# Patient Record
Sex: Female | Born: 1975 | Race: White | Hispanic: No | Marital: Married | Smoking: Former smoker
Health system: Southern US, Community
[De-identification: ages and names within clinical notes are randomized; demographics above are authoritative.]

## PROBLEM LIST (undated history)

## (undated) DIAGNOSIS — R87619 Unspecified abnormal cytological findings in specimens from cervix uteri: Secondary | ICD-10-CM

## (undated) DIAGNOSIS — Z87891 Personal history of nicotine dependence: Secondary | ICD-10-CM

## (undated) DIAGNOSIS — E785 Hyperlipidemia, unspecified: Secondary | ICD-10-CM

## (undated) DIAGNOSIS — N979 Female infertility, unspecified: Secondary | ICD-10-CM

## (undated) DIAGNOSIS — E349 Endocrine disorder, unspecified: Secondary | ICD-10-CM

## (undated) DIAGNOSIS — D649 Anemia, unspecified: Secondary | ICD-10-CM

## (undated) DIAGNOSIS — IMO0002 Reserved for concepts with insufficient information to code with codable children: Secondary | ICD-10-CM

## (undated) DIAGNOSIS — Z803 Family history of malignant neoplasm of breast: Secondary | ICD-10-CM

## (undated) HISTORY — DX: Hyperlipidemia, unspecified: E78.5

## (undated) HISTORY — PX: TOOTH EXTRACTION: SUR596

## (undated) HISTORY — DX: Anemia, unspecified: D64.9

## (undated) HISTORY — DX: Endocrine disorder, unspecified: E34.9

## (undated) HISTORY — DX: Female infertility, unspecified: N97.9

## (undated) HISTORY — DX: Personal history of nicotine dependence: Z87.891

## (undated) HISTORY — PX: DILATION AND CURETTAGE OF UTERUS: SHX78

## (undated) HISTORY — DX: Reserved for concepts with insufficient information to code with codable children: IMO0002

## (undated) HISTORY — DX: Family history of malignant neoplasm of breast: Z80.3

## (undated) HISTORY — DX: Unspecified abnormal cytological findings in specimens from cervix uteri: R87.619

---

## 1998-09-19 ENCOUNTER — Other Ambulatory Visit: Admission: RE | Admit: 1998-09-19 | Discharge: 1998-09-19 | Payer: Self-pay | Admitting: Obstetrics & Gynecology

## 1999-12-22 ENCOUNTER — Other Ambulatory Visit: Admission: RE | Admit: 1999-12-22 | Discharge: 1999-12-22 | Payer: Self-pay | Admitting: Obstetrics & Gynecology

## 2002-08-17 ENCOUNTER — Other Ambulatory Visit: Admission: RE | Admit: 2002-08-17 | Discharge: 2002-08-17 | Payer: Self-pay | Admitting: Obstetrics & Gynecology

## 2002-08-21 ENCOUNTER — Encounter: Admission: RE | Admit: 2002-08-21 | Discharge: 2002-08-21 | Payer: Self-pay | Admitting: Obstetrics & Gynecology

## 2002-08-21 ENCOUNTER — Encounter: Payer: Self-pay | Admitting: Obstetrics & Gynecology

## 2003-12-02 ENCOUNTER — Encounter: Admission: RE | Admit: 2003-12-02 | Discharge: 2003-12-02 | Payer: Self-pay | Admitting: Obstetrics & Gynecology

## 2003-12-02 ENCOUNTER — Other Ambulatory Visit: Admission: RE | Admit: 2003-12-02 | Discharge: 2003-12-02 | Payer: Self-pay | Admitting: Obstetrics & Gynecology

## 2004-12-11 ENCOUNTER — Encounter: Admission: RE | Admit: 2004-12-11 | Discharge: 2004-12-11 | Payer: Self-pay | Admitting: Obstetrics & Gynecology

## 2004-12-11 ENCOUNTER — Other Ambulatory Visit: Admission: RE | Admit: 2004-12-11 | Discharge: 2004-12-11 | Payer: Self-pay | Admitting: Obstetrics & Gynecology

## 2006-01-10 ENCOUNTER — Other Ambulatory Visit: Admission: RE | Admit: 2006-01-10 | Discharge: 2006-01-10 | Payer: Self-pay | Admitting: Obstetrics & Gynecology

## 2006-01-10 ENCOUNTER — Encounter: Admission: RE | Admit: 2006-01-10 | Discharge: 2006-01-10 | Payer: Self-pay | Admitting: Obstetrics & Gynecology

## 2008-09-27 ENCOUNTER — Ambulatory Visit (HOSPITAL_COMMUNITY): Admission: RE | Admit: 2008-09-27 | Discharge: 2008-09-27 | Payer: Self-pay | Admitting: Obstetrics & Gynecology

## 2009-05-16 ENCOUNTER — Encounter: Admission: RE | Admit: 2009-05-16 | Discharge: 2009-05-16 | Payer: Self-pay | Admitting: Obstetrics & Gynecology

## 2010-12-14 ENCOUNTER — Encounter: Payer: Self-pay | Admitting: Obstetrics & Gynecology

## 2011-05-21 ENCOUNTER — Ambulatory Visit (HOSPITAL_COMMUNITY)
Admission: RE | Admit: 2011-05-21 | Discharge: 2011-05-21 | Disposition: A | Discharge status: Home / Self Care | Payer: 59 | Source: Ambulatory Visit | Attending: Obstetrics & Gynecology | Admitting: Obstetrics & Gynecology

## 2011-05-21 ENCOUNTER — Other Ambulatory Visit: Payer: Self-pay | Admitting: Obstetrics & Gynecology

## 2011-05-21 DIAGNOSIS — O021 Missed abortion: Secondary | ICD-10-CM | POA: Insufficient documentation

## 2011-05-21 LAB — CBC
HCT: 34.6 % — ABNORMAL LOW (ref 36.0–46.0)
Hemoglobin: 11.8 g/dL — ABNORMAL LOW (ref 12.0–15.0)
MCH: 30.8 pg (ref 26.0–34.0)
MCHC: 34.1 g/dL (ref 30.0–36.0)
RBC: 3.83 MIL/uL — ABNORMAL LOW (ref 3.87–5.11)
WBC: 5.8 10*3/uL (ref 4.0–10.5)

## 2011-05-21 LAB — ABO/RH: ABO/RH(D): A POS

## 2011-05-23 DEATH — deceased

## 2011-05-24 NOTE — Op Note (Signed)
  [name], [name] NO.:  [id number]  MEDICAL RECORD NO.:  [id number]  LOCATION:  [facility]                          FACILITY:  [facility]  PHYSICIAN:  Ilda Mori, M.D.   DATE OF BIRTH:  1976-06-04  DATE OF PROCEDURE:  05/21/2011 DATE OF DISCHARGE:                              OPERATIVE REPORT   PREOPERATIVE DIAGNOSIS:  Missed abortion.  POSTOPERATIVE DIAGNOSIS:  Missed abortion.  PROCEDURE:  Dilatation and evacuation.  SURGEON:  Ilda Mori, MD.  ANESTHESIA:  Paracervical block with IV sedation.  ESTIMATED BLOOD LOSS:  Minimal.  FINDINGS:  Products of conception consistent with a 7-8 week missed AB.  INDICATIONS:  This is a 35 year old, nulligravid female, who presented to the office 2 weeks ago with 7 weeks of amenorrhea.  Ultrasound was performed which showed a 5-week gestational sac.  The patient returned to the office the following week, which showed some development of the sac, but no yolk sac or fetal pole seen.  She was brought back the subsequent week and unfortunately there still was no yolk sac or fetal development and a diagnosis of missed abortion was confirmed.  Options were given for Cytotec induction of abortion.  The patient requested surgical evacuation.  PROCEDURE:  The patient was taken to the operating room and placed in dorsal lithotomy position.  IV sedation was administered.  The perineum and vagina were prepped and draped in sterile fashion.  The bladder was catheterized.  The cervix was grasped with single-tooth tenaculum and 10 mL of 2% lidocaine was infiltrated in the paracervical tissues.  The internal os was dilated with Shawnie Pons dilators to a 21-French, and #7 suction curette was introduced into the endometrial cavity, and the products of conception were evacuated.  Procedure was then terminated and the patient left the operating room in good condition.     Ilda Mori, M.D.     RK/MEDQ  D:  05/21/2011  T:   Jun 17, 2011  Job:  [id number]  Electronically Signed by Ilda Mori M.D. on 05/24/2011 08:55:04 PM

## 2011-10-29 ENCOUNTER — Other Ambulatory Visit: Payer: Self-pay | Admitting: Obstetrics & Gynecology

## 2011-11-24 LAB — OB RESULTS CONSOLE GC/CHLAMYDIA: Gonorrhea: NEGATIVE

## 2011-11-24 LAB — OB RESULTS CONSOLE RUBELLA ANTIBODY, IGM: Rubella: NON-IMMUNE/NOT IMMUNE

## 2011-11-24 LAB — OB RESULTS CONSOLE HEPATITIS B SURFACE ANTIGEN: Hepatitis B Surface Ag: NEGATIVE

## 2011-11-24 LAB — OB RESULTS CONSOLE RPR: RPR: NONREACTIVE

## 2011-11-24 LAB — OB RESULTS CONSOLE ANTIBODY SCREEN: Antibody Screen: NEGATIVE

## 2011-11-24 LAB — OB RESULTS CONSOLE HIV ANTIBODY (ROUTINE TESTING): HIV: NONREACTIVE

## 2011-11-24 LAB — OB RESULTS CONSOLE ABO/RH: RH Type: POSITIVE

## 2012-05-10 LAB — OB RESULTS CONSOLE GBS: GBS: NEGATIVE

## 2012-06-01 ENCOUNTER — Inpatient Hospital Stay (HOSPITAL_COMMUNITY): Admission: AD | Admit: 2012-06-01 | Payer: Self-pay | Source: Ambulatory Visit | Admitting: Obstetrics & Gynecology

## 2012-06-01 ENCOUNTER — Telehealth (HOSPITAL_COMMUNITY): Payer: Self-pay | Admitting: *Deleted

## 2012-06-01 ENCOUNTER — Encounter (HOSPITAL_COMMUNITY): Payer: Self-pay | Admitting: *Deleted

## 2012-06-01 NOTE — Telephone Encounter (Signed)
Preadmission screen  

## 2012-06-06 ENCOUNTER — Inpatient Hospital Stay (HOSPITAL_COMMUNITY)
Admission: RE | Admit: 2012-06-06 | Discharge: 2012-06-09 | DRG: 371 | Disposition: A | Discharge status: Home / Self Care | Payer: BC Managed Care – PPO | Source: Ambulatory Visit | Attending: Obstetrics and Gynecology | Admitting: Obstetrics and Gynecology

## 2012-06-06 VITALS — BP 107/68 | HR 78 | Temp 99.1°F | Resp 19 | Ht 68.0 in | Wt 190.0 lb

## 2012-06-06 DIAGNOSIS — Z3480 Encounter for supervision of other normal pregnancy, unspecified trimester: Secondary | ICD-10-CM

## 2012-06-06 DIAGNOSIS — O09529 Supervision of elderly multigravida, unspecified trimester: Secondary | ICD-10-CM | POA: Diagnosis present

## 2012-06-06 DIAGNOSIS — O480 Post-term pregnancy: Principal | ICD-10-CM | POA: Diagnosis present

## 2012-06-06 LAB — CBC
MCV: 89.8 fL (ref 78.0–100.0)
Platelets: 230 10*3/uL (ref 150–400)
RBC: 4.02 MIL/uL (ref 3.87–5.11)
RDW: 12.8 % (ref 11.5–15.5)
WBC: 9.2 10*3/uL (ref 4.0–10.5)

## 2012-06-06 MED ORDER — BUTORPHANOL TARTRATE 2 MG/ML IJ SOLN
1.0000 mg | INTRAMUSCULAR | Status: DC | PRN
  Administered 2012-06-07: 1 mg via INTRAVENOUS
  Filled 2012-06-06: qty 1

## 2012-06-06 MED ORDER — MISOPROSTOL 25 MCG QUARTER TABLET
25.0000 ug | ORAL_TABLET | ORAL | Status: DC | PRN
  Administered 2012-06-06 – 2012-06-07 (×2): 25 ug via VAGINAL
  Filled 2012-06-06 (×3): qty 0.25

## 2012-06-06 MED ORDER — ZOLPIDEM TARTRATE 5 MG PO TABS: 5.0000 mg | ORAL_TABLET | Freq: Every evening | ORAL | Status: DC | PRN

## 2012-06-06 MED ORDER — LIDOCAINE HCL (PF) 1 % IJ SOLN: 30.0000 mL | INTRAMUSCULAR | Status: DC | PRN

## 2012-06-06 MED ORDER — OXYTOCIN BOLUS FROM INFUSION
250.0000 mL | Freq: Once | INTRAVENOUS | Status: DC
  Filled 2012-06-06: qty 500

## 2012-06-06 MED ORDER — CITRIC ACID-SODIUM CITRATE 334-500 MG/5ML PO SOLN
30.0000 mL | ORAL | Status: DC | PRN
  Administered 2012-06-07: 30 mL via ORAL
  Filled 2012-06-06: qty 15

## 2012-06-06 MED ORDER — LACTATED RINGERS IV SOLN
INTRAVENOUS | Status: DC
  Administered 2012-06-07 (×6): via INTRAVENOUS

## 2012-06-06 MED ORDER — ONDANSETRON HCL 4 MG/2ML IJ SOLN: 4.0000 mg | Freq: Four times a day (QID) | INTRAMUSCULAR | Status: DC | PRN

## 2012-06-06 MED ORDER — ACETAMINOPHEN 325 MG PO TABS: 650.0000 mg | ORAL_TABLET | ORAL | Status: DC | PRN

## 2012-06-06 MED ORDER — OXYCODONE-ACETAMINOPHEN 5-325 MG PO TABS: 1.0000 | ORAL_TABLET | ORAL | Status: DC | PRN

## 2012-06-06 MED ORDER — OXYTOCIN 40 UNITS IN LACTATED RINGERS INFUSION - SIMPLE MED: 62.5000 mL/h | Freq: Once | INTRAVENOUS | Status: DC

## 2012-06-06 MED ORDER — TERBUTALINE SULFATE 1 MG/ML IJ SOLN: 0.2500 mg | Freq: Once | INTRAMUSCULAR | Status: AC | PRN

## 2012-06-06 MED ORDER — LACTATED RINGERS IV SOLN: INTRAVENOUS | Status: DC

## 2012-06-06 MED ORDER — LACTATED RINGERS IV SOLN: 500.0000 mL | INTRAVENOUS | Status: DC | PRN

## 2012-06-06 MED ORDER — IBUPROFEN 600 MG PO TABS: 600.0000 mg | ORAL_TABLET | Freq: Four times a day (QID) | ORAL | Status: DC | PRN

## 2012-06-06 MED ORDER — LACTATED RINGERS IV SOLN
500.0000 mL | INTRAVENOUS | Status: DC | PRN
  Administered 2012-06-07: 500 mL via INTRAVENOUS
  Administered 2012-06-07: 1000 mL via INTRAVENOUS
  Administered 2012-06-07: 500 mL via INTRAVENOUS
  Administered 2012-06-07: 1000 mL via INTRAVENOUS

## 2012-06-06 MED ORDER — FLEET ENEMA 7-19 GM/118ML RE ENEM: 1.0000 | ENEMA | RECTAL | Status: DC | PRN

## 2012-06-06 NOTE — H&P (Signed)
36 y.o. G2P0010  Estimated Date of Delivery: 06/01/12 admitted at 40/[redacted] weeks gestation for post date induction.  Prenatal Transfer Tool  Maternal Diabetes: No Genetic Screening: Normal Maternal Ultrasounds/Referrals: Normal Fetal Ultrasounds or other Referrals:  None Maternal Substance Abuse:  No Significant Maternal Medications:  None Significant Maternal Lab Results: None Other Significant Pregnancy Complications:  H/o infertility  Afebrile, VSS Heart and Lungs: No active disease Abdomen: soft, gravid, EFW AGA. Cervical exam:  1/50, vtx -2  Impression: post dates    Plan:  Cytotec tonight for cervical ripening.  Reassess in am  - if >2/80, start pitocin; if not, continue cytotec.

## 2012-06-07 ENCOUNTER — Inpatient Hospital Stay (HOSPITAL_COMMUNITY): Payer: BC Managed Care – PPO | Admitting: Anesthesiology

## 2012-06-07 ENCOUNTER — Encounter (HOSPITAL_COMMUNITY): Payer: Self-pay | Admitting: Anesthesiology

## 2012-06-07 ENCOUNTER — Encounter (HOSPITAL_COMMUNITY)
Admission: RE | Disposition: A | Discharge status: Home / Self Care | Payer: Self-pay | Source: Ambulatory Visit | Attending: Obstetrics and Gynecology

## 2012-06-07 ENCOUNTER — Encounter (HOSPITAL_COMMUNITY): Payer: Self-pay

## 2012-06-07 LAB — RPR: RPR Ser Ql: NONREACTIVE

## 2012-06-07 SURGERY — Surgical Case
Anesthesia: Regional

## 2012-06-07 MED ORDER — TERBUTALINE SULFATE 1 MG/ML IJ SOLN: 0.2500 mg | Freq: Once | INTRAMUSCULAR | Status: DC | PRN

## 2012-06-07 MED ORDER — NALBUPHINE HCL 10 MG/ML IJ SOLN
5.0000 mg | INTRAMUSCULAR | Status: DC | PRN
  Filled 2012-06-07: qty 1

## 2012-06-07 MED ORDER — DIPHENHYDRAMINE HCL 25 MG PO CAPS: 25.0000 mg | ORAL_CAPSULE | Freq: Four times a day (QID) | ORAL | Status: DC | PRN

## 2012-06-07 MED ORDER — SCOPOLAMINE 1 MG/3DAYS TD PT72
MEDICATED_PATCH | TRANSDERMAL | Status: AC
  Filled 2012-06-07: qty 1

## 2012-06-07 MED ORDER — LACTATED RINGERS IV SOLN
INTRAVENOUS | Status: DC
  Administered 2012-06-08: 03:00:00 via INTRAVENOUS

## 2012-06-07 MED ORDER — SODIUM CHLORIDE 0.9 % IJ SOLN: 3.0000 mL | INTRAMUSCULAR | Status: DC | PRN

## 2012-06-07 MED ORDER — DIPHENHYDRAMINE HCL 25 MG PO CAPS: 25.0000 mg | ORAL_CAPSULE | ORAL | Status: DC | PRN

## 2012-06-07 MED ORDER — ONDANSETRON HCL 4 MG/2ML IJ SOLN: 4.0000 mg | INTRAMUSCULAR | Status: DC | PRN

## 2012-06-07 MED ORDER — OXYCODONE-ACETAMINOPHEN 5-325 MG PO TABS
1.0000 | ORAL_TABLET | ORAL | Status: DC | PRN
  Administered 2012-06-08 – 2012-06-09 (×5): 1 via ORAL
  Filled 2012-06-07 (×5): qty 1

## 2012-06-07 MED ORDER — SCOPOLAMINE 1 MG/3DAYS TD PT72
1.0000 | MEDICATED_PATCH | Freq: Once | TRANSDERMAL | Status: DC
  Administered 2012-06-07: 1.5 mg via TRANSDERMAL

## 2012-06-07 MED ORDER — PHENYLEPHRINE 40 MCG/ML (10ML) SYRINGE FOR IV PUSH (FOR BLOOD PRESSURE SUPPORT)
80.0000 ug | PREFILLED_SYRINGE | INTRAVENOUS | Status: DC | PRN
  Filled 2012-06-07: qty 5

## 2012-06-07 MED ORDER — HYDROMORPHONE HCL PF 1 MG/ML IJ SOLN: 0.2500 mg | INTRAMUSCULAR | Status: DC | PRN

## 2012-06-07 MED ORDER — CEFAZOLIN SODIUM-DEXTROSE 2-3 GM-% IV SOLR
INTRAVENOUS | Status: AC
  Filled 2012-06-07: qty 50

## 2012-06-07 MED ORDER — SIMETHICONE 80 MG PO CHEW
80.0000 mg | CHEWABLE_TABLET | Freq: Three times a day (TID) | ORAL | Status: DC
  Administered 2012-06-08 – 2012-06-09 (×4): 80 mg via ORAL

## 2012-06-07 MED ORDER — ONDANSETRON HCL 4 MG/2ML IJ SOLN
INTRAMUSCULAR | Status: DC | PRN
  Administered 2012-06-07: 4 mg via INTRAVENOUS

## 2012-06-07 MED ORDER — PHENYLEPHRINE 40 MCG/ML (10ML) SYRINGE FOR IV PUSH (FOR BLOOD PRESSURE SUPPORT): 80.0000 ug | PREFILLED_SYRINGE | INTRAVENOUS | Status: DC | PRN

## 2012-06-07 MED ORDER — NALOXONE HCL 0.4 MG/ML IJ SOLN: 0.4000 mg | INTRAMUSCULAR | Status: DC | PRN

## 2012-06-07 MED ORDER — OXYTOCIN 40 UNITS IN LACTATED RINGERS INFUSION - SIMPLE MED: 62.5000 mL/h | INTRAVENOUS | Status: AC

## 2012-06-07 MED ORDER — DIBUCAINE 1 % RE OINT: 1.0000 "application " | TOPICAL_OINTMENT | RECTAL | Status: DC | PRN

## 2012-06-07 MED ORDER — MEPERIDINE HCL 25 MG/ML IJ SOLN: 6.2500 mg | INTRAMUSCULAR | Status: DC | PRN

## 2012-06-07 MED ORDER — LACTATED RINGERS IV SOLN: 500.0000 mL | Freq: Once | INTRAVENOUS | Status: DC

## 2012-06-07 MED ORDER — EPHEDRINE 5 MG/ML INJ: 10.0000 mg | INTRAVENOUS | Status: DC | PRN

## 2012-06-07 MED ORDER — OXYTOCIN 10 UNIT/ML IJ SOLN
40.0000 [IU] | INTRAVENOUS | Status: DC | PRN
  Administered 2012-06-07: 40 [IU] via INTRAVENOUS

## 2012-06-07 MED ORDER — SODIUM BICARBONATE 8.4 % IV SOLN
INTRAVENOUS | Status: DC | PRN
  Administered 2012-06-07: 4 mL via EPIDURAL

## 2012-06-07 MED ORDER — IBUPROFEN 600 MG PO TABS
600.0000 mg | ORAL_TABLET | Freq: Four times a day (QID) | ORAL | Status: DC
  Administered 2012-06-08 – 2012-06-09 (×6): 600 mg via ORAL
  Filled 2012-06-07 (×6): qty 1

## 2012-06-07 MED ORDER — OXYTOCIN 40 UNITS IN LACTATED RINGERS INFUSION - SIMPLE MED
1.0000 m[IU]/min | INTRAVENOUS | Status: DC
  Administered 2012-06-07: 2 m[IU]/min via INTRAVENOUS
  Filled 2012-06-07: qty 1000

## 2012-06-07 MED ORDER — MORPHINE SULFATE (PF) 0.5 MG/ML IJ SOLN
INTRAMUSCULAR | Status: DC | PRN
  Administered 2012-06-07: 4 mg via EPIDURAL

## 2012-06-07 MED ORDER — EPHEDRINE 5 MG/ML INJ
10.0000 mg | INTRAVENOUS | Status: DC | PRN
  Filled 2012-06-07: qty 4

## 2012-06-07 MED ORDER — KETOROLAC TROMETHAMINE 60 MG/2ML IM SOLN
60.0000 mg | Freq: Once | INTRAMUSCULAR | Status: AC | PRN
  Administered 2012-06-07: 60 mg via INTRAMUSCULAR

## 2012-06-07 MED ORDER — SIMETHICONE 80 MG PO CHEW: 80.0000 mg | CHEWABLE_TABLET | ORAL | Status: DC | PRN

## 2012-06-07 MED ORDER — MEPERIDINE HCL 25 MG/ML IJ SOLN
INTRAMUSCULAR | Status: DC | PRN
  Administered 2012-06-07: 12.5 mg via INTRAVENOUS

## 2012-06-07 MED ORDER — ZOLPIDEM TARTRATE 5 MG PO TABS: 5.0000 mg | ORAL_TABLET | Freq: Every evening | ORAL | Status: DC | PRN

## 2012-06-07 MED ORDER — FENTANYL 2.5 MCG/ML BUPIVACAINE 1/10 % EPIDURAL INFUSION (WH - ANES)
14.0000 mL/h | INTRAMUSCULAR | Status: DC
  Filled 2012-06-07 (×2): qty 60

## 2012-06-07 MED ORDER — SENNOSIDES-DOCUSATE SODIUM 8.6-50 MG PO TABS
2.0000 | ORAL_TABLET | Freq: Every day | ORAL | Status: DC
  Administered 2012-06-08: 2 via ORAL

## 2012-06-07 MED ORDER — FENTANYL 2.5 MCG/ML BUPIVACAINE 1/10 % EPIDURAL INFUSION (WH - ANES)
INTRAMUSCULAR | Status: DC | PRN
  Administered 2012-06-07: 14 mL/h via EPIDURAL
  Administered 2012-06-07: 17:00:00

## 2012-06-07 MED ORDER — WITCH HAZEL-GLYCERIN EX PADS: 1.0000 "application " | MEDICATED_PAD | CUTANEOUS | Status: DC | PRN

## 2012-06-07 MED ORDER — SODIUM CHLORIDE 0.9 % IV SOLN
1.0000 ug/kg/h | INTRAVENOUS | Status: DC | PRN
  Filled 2012-06-07: qty 2.5

## 2012-06-07 MED ORDER — TETANUS-DIPHTH-ACELL PERTUSSIS 5-2.5-18.5 LF-MCG/0.5 IM SUSP
0.5000 mL | Freq: Once | INTRAMUSCULAR | Status: AC
  Administered 2012-06-08: 0.5 mL via INTRAMUSCULAR
  Filled 2012-06-07: qty 0.5

## 2012-06-07 MED ORDER — PHENYLEPHRINE 40 MCG/ML (10ML) SYRINGE FOR IV PUSH (FOR BLOOD PRESSURE SUPPORT)
PREFILLED_SYRINGE | INTRAVENOUS | Status: AC
  Filled 2012-06-07: qty 20

## 2012-06-07 MED ORDER — KETOROLAC TROMETHAMINE 60 MG/2ML IM SOLN
INTRAMUSCULAR | Status: AC
  Filled 2012-06-07: qty 2

## 2012-06-07 MED ORDER — DIPHENHYDRAMINE HCL 50 MG/ML IJ SOLN: 12.5000 mg | INTRAMUSCULAR | Status: DC | PRN

## 2012-06-07 MED ORDER — KETOROLAC TROMETHAMINE 30 MG/ML IJ SOLN: 15.0000 mg | Freq: Once | INTRAMUSCULAR | Status: DC | PRN

## 2012-06-07 MED ORDER — CEFAZOLIN SODIUM-DEXTROSE 2-3 GM-% IV SOLR
2.0000 g | Freq: Once | INTRAVENOUS | Status: AC
  Administered 2012-06-07: 2 g via INTRAVENOUS
  Filled 2012-06-07: qty 50

## 2012-06-07 MED ORDER — PRENATAL MULTIVITAMIN CH
1.0000 | ORAL_TABLET | Freq: Every day | ORAL | Status: DC
  Administered 2012-06-08 – 2012-06-09 (×2): 1 via ORAL
  Filled 2012-06-07 (×2): qty 1

## 2012-06-07 MED ORDER — MENTHOL 3 MG MT LOZG: 1.0000 | LOZENGE | OROMUCOSAL | Status: DC | PRN

## 2012-06-07 MED ORDER — TERBUTALINE SULFATE 1 MG/ML IJ SOLN
INTRAMUSCULAR | Status: AC
  Filled 2012-06-07: qty 1

## 2012-06-07 MED ORDER — KETOROLAC TROMETHAMINE 30 MG/ML IJ SOLN: 30.0000 mg | Freq: Four times a day (QID) | INTRAMUSCULAR | Status: AC | PRN

## 2012-06-07 MED ORDER — ONDANSETRON HCL 4 MG PO TABS: 4.0000 mg | ORAL_TABLET | ORAL | Status: DC | PRN

## 2012-06-07 MED ORDER — LANOLIN HYDROUS EX OINT: 1.0000 "application " | TOPICAL_OINTMENT | CUTANEOUS | Status: DC | PRN

## 2012-06-07 MED ORDER — LACTATED RINGERS IV SOLN
INTRAVENOUS | Status: DC | PRN
  Administered 2012-06-07: 21:00:00 via INTRAVENOUS

## 2012-06-07 MED ORDER — ONDANSETRON HCL 4 MG/2ML IJ SOLN: 4.0000 mg | Freq: Three times a day (TID) | INTRAMUSCULAR | Status: DC | PRN

## 2012-06-07 MED ORDER — DIPHENHYDRAMINE HCL 50 MG/ML IJ SOLN: 25.0000 mg | INTRAMUSCULAR | Status: DC | PRN

## 2012-06-07 MED ORDER — METOCLOPRAMIDE HCL 5 MG/ML IJ SOLN: 10.0000 mg | Freq: Three times a day (TID) | INTRAMUSCULAR | Status: DC | PRN

## 2012-06-07 MED ORDER — MORPHINE SULFATE 0.5 MG/ML IJ SOLN
INTRAMUSCULAR | Status: AC
  Filled 2012-06-07: qty 10

## 2012-06-07 MED ORDER — MORPHINE SULFATE (PF) 0.5 MG/ML IJ SOLN
INTRAMUSCULAR | Status: DC | PRN
  Administered 2012-06-07 (×2): .5 mg via EPIDURAL

## 2012-06-07 MED ORDER — OXYTOCIN 10 UNIT/ML IJ SOLN
INTRAMUSCULAR | Status: AC
  Filled 2012-06-07: qty 4

## 2012-06-07 MED ORDER — PHENYLEPHRINE HCL 10 MG/ML IJ SOLN
INTRAMUSCULAR | Status: DC | PRN
  Administered 2012-06-07 (×6): 80 ug via INTRAVENOUS

## 2012-06-07 MED ORDER — PROMETHAZINE HCL 25 MG/ML IJ SOLN: 6.2500 mg | INTRAMUSCULAR | Status: DC | PRN

## 2012-06-07 MED ORDER — ONDANSETRON HCL 4 MG/2ML IJ SOLN
INTRAMUSCULAR | Status: AC
  Filled 2012-06-07: qty 2

## 2012-06-07 MED ORDER — MEPERIDINE HCL 25 MG/ML IJ SOLN
INTRAMUSCULAR | Status: AC
  Filled 2012-06-07: qty 1

## 2012-06-07 SURGICAL SUPPLY — 35 items
CLOTH BEACON ORANGE TIMEOUT ST (SAFETY) ×2 IMPLANT
CONTAINER PREFILL 10% NBF 15ML (MISCELLANEOUS) IMPLANT
DERMABOND ADVANCED (GAUZE/BANDAGES/DRESSINGS) ×1
DERMABOND ADVANCED .7 DNX12 (GAUZE/BANDAGES/DRESSINGS) ×1 IMPLANT
DRSG COVADERM 4X10 (GAUZE/BANDAGES/DRESSINGS) ×2 IMPLANT
ELECT REM PT RETURN 9FT ADLT (ELECTROSURGICAL) ×2
ELECTRODE REM PT RTRN 9FT ADLT (ELECTROSURGICAL) ×1 IMPLANT
EXTRACTOR VACUUM M CUP 4 TUBE (SUCTIONS) IMPLANT
GLOVE ECLIPSE 6.0 STRL STRAW (GLOVE) ×2 IMPLANT
GLOVE ECLIPSE 6.5 STRL STRAW (GLOVE) ×2 IMPLANT
GLOVE INDICATOR 7.5 STRL GRN (GLOVE) ×2 IMPLANT
GLOVE SURG SS PI 7.5 STRL IVOR (GLOVE) ×4 IMPLANT
GOWN PREVENTION PLUS LG XLONG (DISPOSABLE) ×6 IMPLANT
KIT ABG SYR 3ML LUER SLIP (SYRINGE) ×2 IMPLANT
NEEDLE HYPO 25X5/8 SAFETYGLIDE (NEEDLE) ×2 IMPLANT
NS IRRIG 1000ML POUR BTL (IV SOLUTION) ×2 IMPLANT
PACK C SECTION WH (CUSTOM PROCEDURE TRAY) ×2 IMPLANT
RTRCTR C-SECT PINK 25CM LRG (MISCELLANEOUS) IMPLANT
SLEEVE SCD COMPRESS KNEE MED (MISCELLANEOUS) ×2 IMPLANT
STAPLER VISISTAT 35W (STAPLE) ×2 IMPLANT
SUT MNCRL AB 4-0 PS2 18 (SUTURE) ×2 IMPLANT
SUT PLAIN 0 NONE (SUTURE) IMPLANT
SUT VIC AB 0 CT1 27 (SUTURE) ×3
SUT VIC AB 0 CT1 27XBRD ANBCTR (SUTURE) ×3 IMPLANT
SUT VIC AB 1 CTX 36 (SUTURE) ×2
SUT VIC AB 1 CTX36XBRD ANBCTRL (SUTURE) ×2 IMPLANT
SUT VIC AB 3-0 CT1 27 (SUTURE) ×2
SUT VIC AB 3-0 CT1 TAPERPNT 27 (SUTURE) ×2 IMPLANT
SUT VIC AB 3-0 PS2 18 (SUTURE)
SUT VIC AB 3-0 PS2 18XBRD (SUTURE) IMPLANT
SUT VIC AB 3-0 SH 27 (SUTURE)
SUT VIC AB 3-0 SH 27X BRD (SUTURE) IMPLANT
TOWEL OR 17X24 6PK STRL BLUE (TOWEL DISPOSABLE) ×4 IMPLANT
TRAY FOLEY CATH 14FR (SET/KITS/TRAYS/PACK) ×2 IMPLANT
WATER STERILE IRR 1000ML POUR (IV SOLUTION) ×2 IMPLANT

## 2012-06-07 NOTE — Op Note (Signed)
Patient Name: Tamara [name] MRN: [id number]  Date of Surgery: 06/07/2012    PREOPERATIVE DIAGNOSIS: Failed induction, category 2 tracing  POSTOPERATIVE DIAGNOSIS: Failed induction, category 2 tracing   PROCEDURE: Low transverse cesarean section  SURGEON: Caralyn Guile. Arlyce Dice M.D.  ANESTHESIA: Epidural  ESTIMATED BLOOD LOSS: * No blood loss amount entered *  FINDINGS: Female, 7 lbs. 14 oz., Apgar 9,9; clear amniotic fluid, normal adnexa and uterus, single nuchal cord.   INDICATIONS: This is a 36 y.o.  [place] who was admitted for post date induction.  After cytotec, SROM, and adequate labor with pitocin for 10 hours she had not progressed out of the latent phase and the vertex was still high and poorly applied.  In addition she had episodic prolonged variable decelerations.  The decision was made to proceed with cesarean delivery.  PROCEDURE IN DETAIL: The patient was taken to the operating room and the epidural that had been placed in labor was re injected for surgical anesthesia.  She was then placed in the supine position with left lateral displacement of the uterus. The abdomen was prepped and draped in a sterile fashion and the bladder was catheterized.  A low transverse abdominal incision was made and carried down to the fascia. The fascia was opened transversely and the rectus sheath was dissected from the underlying rectus muscle. The rectus midline was identified and opened by sharp and blunt dissection. The peritoneum was opened. An Alexis retractor was placed and the lower uterine segment was identified, entered transversely by careful sharp dissection, and extended bluntly.  The infant was delivered without difficulty. A single nuchal cord was noted.  The placenta was delivered with cord traction. The uterus was bluntly curettage. The lower segment was closed with running interlocking Vicryl 1 suture.  A second imbricating Vicryl 1 suture line was placed. The peritoneum and rectus  muscle were closed in the midline with running 3-0 Vicryl suture. The fascia was closed with running 0 Vicryl suture.  The subcutaneous tissue was closed with 3-0 vicryl, and the skin was closed with 4-0 monocryl. All sponge and instrument counts were correct.  The patient tolerated the procedure well and left the operating room in good condition.

## 2012-06-07 NOTE — Progress Notes (Signed)
[place] tracing maternal HR--pt in position for epidural placement

## 2012-06-07 NOTE — Anesthesia Procedure Notes (Signed)
Epidural   Additional Notes Dosing of Epidural:  1st dose, through needle ............................................Marland Kitchen epi 1:200K + Xylocaine 40 mg  2nd dose, through catheter, after waiting 3 minutes...Marland KitchenMarland Kitchenepi 1:200K + Xylocaine 40 mg  3rd dose, through catheter after waiting 3 minutes .............................Marcaine   4mg    ( mg Marcaine are expressed as equivilent  cc's medication removed from the 0.1%Bupiv / fentanyl syringe from L&D pump)  ( 2% Xylo charted as a single dose in Epic Meds for ease of charting; actual dosing was fractionated as above, for saftey's sake)  As each dose occurred, patient was free of IV sx; and patient exhibited no evidence of SA injection.  Patient is more comfortable after epidural dosed. Please see RN's note for documentation of vital signs,and FHR which are stable.  Patient reminded not to try to ambulate with numb legs, and that an RN must be present the 1st time she attempts to get up.

## 2012-06-07 NOTE — Progress Notes (Signed)
FHT are category 2.  It shows moderate variability with spontaneous accels and accels with scalp stimulus.  There are, however, episodic (every 45 to 60 minutes) prolonged variable decels down in the 70's.  On my exam the vertex is high (-2) and poorly applied to the cervix.  The cervix is 3 cm dilated and 80% effaced.  Imp: 36 year old primigravida with poor progress in induction for post dates.  Will abandon TOL due to persistence of episodic unexplained decels.    Plan:  Proceed with delivery by C/S.

## 2012-06-07 NOTE — Progress Notes (Signed)
Updated provider on FHR and that pitocin was stopped--orders to restart pitocin and continue to assess

## 2012-06-07 NOTE — Transfer of Care (Signed)
Immediate Anesthesia Transfer of Care Note  Patient: Tamara [name]  Procedure(s) Performed: Procedure(s) (LRB): CESAREAN SECTION (N/A)  Patient Location: PACU  Anesthesia Type: Epidural  Level of Consciousness: awake, alert  and oriented  Airway & Oxygen Therapy: Patient Spontanous Breathing  Post-op Assessment: Report given to PACU RN  Post vital signs: Reviewed and stable  Complications: No apparent anesthesia complications

## 2012-06-07 NOTE — Progress Notes (Signed)
Provider notified of prolonged decel--MD reviewed strip, orders to restart pitocin at 

## 2012-06-07 NOTE — Anesthesia Preprocedure Evaluation (Signed)

## 2012-06-07 NOTE — Progress Notes (Signed)
Received 1 dose of cytotec.  Did not receive 2nd dose due to uterine activity.  Patient experienced SROM at 4:30 am.  Cervix now 1/50, vtx -2.  IUPC placed.  FHT are reactive with moderate variability.  She has experienced 2 or 3 episodes of prolonged decelerations throughout the night.    Will augment with pitocin and continue TOL.

## 2012-06-07 NOTE — Progress Notes (Signed)
Discussed with MD pt [facility]-orders to hold pitocin at this time

## 2012-06-08 ENCOUNTER — Encounter (HOSPITAL_COMMUNITY): Payer: Self-pay | Admitting: Obstetrics & Gynecology

## 2012-06-08 LAB — CBC
HCT: 27.6 % — ABNORMAL LOW (ref 36.0–46.0)
MCHC: 33.3 g/dL (ref 30.0–36.0)
Platelets: 186 10*3/uL (ref 150–400)
RDW: 12.7 % (ref 11.5–15.5)

## 2012-06-08 NOTE — Progress Notes (Signed)
Pt BP running low--mid 80s/50s. Says she normally is 100/60s. Output normal, fluids encouraged. Other VSS. Incision WNL. Pt/ states she feels tired, but no fainting, headache, persistent dizziness. MD aware and no new orders given

## 2012-06-08 NOTE — Anesthesia Postprocedure Evaluation (Signed)
  Anesthesia Post-op Note  Patient: Tamara [name]  Procedure(s) Performed: Procedure(s) (LRB): CESAREAN SECTION (N/A)  Patient Location: Mother/Baby  Anesthesia Type: Epidural  Level of Consciousness: awake  Airway and Oxygen Therapy: Patient Spontanous Breathing  Post-op Pain: none  Post-op Assessment: Patient's Cardiovascular Status Stable, Respiratory Function Stable, Patent Airway, No signs of Nausea or vomiting, Adequate PO intake, Pain level controlled, No headache, No backache, No residual numbness and No residual motor weakness  Post-op Vital Signs: Reviewed and stable  Complications: No apparent anesthesia complications

## 2012-06-08 NOTE — Progress Notes (Signed)
Subjective: Postpartum Day 1: Cesarean Delivery Doing well, pain controlled. Catheter still in place.  Objective: Vital signs in last 24 hours: Filed Vitals:   06/08/12 0429 06/08/12 0434 06/08/12 0630 06/08/12 0830  BP: 84/48 85/55 88/50  90/48  Pulse: 104 97 69 62  Temp:   97.9 F (36.6 C) 98.1 F (36.7 C)  TempSrc:   Oral Oral  Resp: 16 18 16 16   Height:      Weight:      SpO2:   96% 97%    Physical Exam:  General: alert, cooperative and appears stated age Lochia: appropriate Uterine Fundus: firm Incision: healing well   Basename 06/08/12 0535 06/06/12 2045  HGB 9.2* 12.1  HCT 27.6* 36.1    Assessment/Plan: Status post Cesarean section. Doing well postoperatively.  Continue current care. D/C foley, saline well IVF Neonatal circ discussed. R/B/A reviewed.  Will proceed  Cybil [place] H. 06/08/2012, 9:07 AM

## 2012-06-09 MED ORDER — OXYCODONE-ACETAMINOPHEN 5-325 MG PO TABS: 1.0000 | ORAL_TABLET | ORAL | Status: AC | PRN

## 2012-06-09 NOTE — Discharge Summary (Unsigned)
NAMEKHADIJAH, MASTRIANNI NO.:  [id number]  MEDICAL RECORD NO.:  [id number]  LOCATION:  9104                          FACILITY:  [facility]  PHYSICIAN:  Malva Limes, M.D.    DATE OF BIRTH:  [birth year]  DATE OF ADMISSION:  06/06/2012 DATE OF DISCHARGE:  06/09/2012                              DISCHARGE SUMMARY   PRINCIPAL DISCHARGE DIAGNOSES: 1. Intrauterine pregnancy at 40-6/7th weeks estimated gestational age. 2. Failure to progress. 3. Repetitive prolonged variable decelerations.  PRINCIPAL PROCEDURES: 1. Cytotec induction. 2. Low-transverse cesarean section.  HISTORY OF PRESENT ILLNESS:  Ms. Edrington is a 36 year old, white female, G2, P1-0-1-1 at 40-6/7th weeks, who was admitted by Dr. Ilda Mori on June 06, 2012 for postdate induction.  The patient was given Cytotec on the night of admission at 4:30 a.m.  She had spontaneous rupture of membranes.  Pitocin augmentation was begun.  The patient was given a trial at labor, where she failed to progress at a latent phase.  It was difficult to proceed because of episodic prolonged variable decelerations.  The complete description of the operation can be found dictated in the operative note.  The patient's postoperative course was benign.  On discharge, she was ambulating without difficulty, eating a regular diet.  She remained afebrile.  Her incision appeared to be healing well.  She delivered 1 live viable white female infant, who is doing well at the time of discharge.  The patient was sent home with Percocet to take p.r.n.  She will follow up in the office in 4 weeks.  She was told to call the office with any temperature elevations or problems with her incision.          ______________________________ Malva Limes, M.D.     MA/MEDQ  D:  06/09/2012  T:  06/09/2012  Job:  [id number]

## 2012-06-09 NOTE — Progress Notes (Signed)
POD#2 Pt states that she is doing well. Would like to go home. VSSAF IMP/ stable Plan/ discharge

## 2014-08-12 ENCOUNTER — Other Ambulatory Visit: Payer: Self-pay

## 2014-08-12 DIAGNOSIS — Z803 Family history of malignant neoplasm of breast: Secondary | ICD-10-CM

## 2014-08-12 DIAGNOSIS — Z1231 Encounter for screening mammogram for malignant neoplasm of breast: Secondary | ICD-10-CM

## 2014-08-13 ENCOUNTER — Other Ambulatory Visit: Payer: Self-pay

## 2014-08-14 LAB — CYTOLOGY - PAP

## 2014-08-29 ENCOUNTER — Ambulatory Visit
Admission: RE | Admit: 2014-08-29 | Discharge: 2014-08-29 | Disposition: A | Discharge status: Home / Self Care | Payer: BC Managed Care – PPO | Source: Ambulatory Visit

## 2014-08-29 DIAGNOSIS — Z803 Family history of malignant neoplasm of breast: Secondary | ICD-10-CM

## 2014-08-29 DIAGNOSIS — Z1231 Encounter for screening mammogram for malignant neoplasm of breast: Secondary | ICD-10-CM

## 2014-09-23 ENCOUNTER — Encounter (HOSPITAL_COMMUNITY): Payer: Self-pay | Admitting: Obstetrics & Gynecology

## 2015-12-22 ENCOUNTER — Emergency Department (HOSPITAL_COMMUNITY): Payer: 59

## 2015-12-22 ENCOUNTER — Encounter (HOSPITAL_COMMUNITY): Payer: Self-pay | Admitting: Emergency Medicine

## 2015-12-22 ENCOUNTER — Emergency Department (HOSPITAL_COMMUNITY)
Admission: EM | Admit: 2015-12-22 | Discharge: 2015-12-22 | Disposition: A | Discharge status: Home / Self Care | Payer: 59 | Attending: Emergency Medicine | Admitting: Emergency Medicine

## 2015-12-22 DIAGNOSIS — R079 Chest pain, unspecified: Secondary | ICD-10-CM | POA: Diagnosis present

## 2015-12-22 DIAGNOSIS — Z79899 Other long term (current) drug therapy: Secondary | ICD-10-CM | POA: Diagnosis not present

## 2015-12-22 DIAGNOSIS — Z880 Allergy status to penicillin: Secondary | ICD-10-CM | POA: Insufficient documentation

## 2015-12-22 DIAGNOSIS — F410 Panic disorder [episodic paroxysmal anxiety] without agoraphobia: Secondary | ICD-10-CM | POA: Insufficient documentation

## 2015-12-22 LAB — CBC
HCT: 41.6 % (ref 36.0–46.0)
HEMOGLOBIN: 13.9 g/dL (ref 12.0–15.0)
MCH: 29.9 pg (ref 26.0–34.0)
MCHC: 33.4 g/dL (ref 30.0–36.0)
MCV: 89.5 fL (ref 78.0–100.0)
PLATELETS: 266 10*3/uL (ref 150–400)
RBC: 4.65 MIL/uL (ref 3.87–5.11)
RDW: 11.9 % (ref 11.5–15.5)
WBC: 4.4 10*3/uL (ref 4.0–10.5)

## 2015-12-22 LAB — BASIC METABOLIC PANEL
ANION GAP: 9 (ref 5–15)
BUN: 9 mg/dL (ref 6–20)
CALCIUM: 9.2 mg/dL (ref 8.9–10.3)
CO2: 26 mmol/L (ref 22–32)
Chloride: 104 mmol/L (ref 101–111)
Creatinine, Ser: 0.71 mg/dL (ref 0.44–1.00)
Glucose, Bld: 99 mg/dL (ref 65–99)
Potassium: 3.9 mmol/L (ref 3.5–5.1)
SODIUM: 139 mmol/L (ref 135–145)

## 2015-12-22 LAB — CBG MONITORING, ED
Glucose-Capillary: 92 mg/dL (ref 65–99)
Glucose-Capillary: 95 mg/dL (ref 65–99)

## 2015-12-22 LAB — I-STAT TROPONIN, ED: Troponin i, poc: 0 ng/mL (ref 0.00–0.08)

## 2015-12-22 NOTE — ED Notes (Signed)
Pt states while at work began having sharp chest pain radiating into back. States it made her feel lightheaded and "dazed." Says the chest pains have subsided, but now is having a throbbing headache with ear pain. Pt states she feels like she has to take deep breaths, denies N/V/D

## 2015-12-22 NOTE — ED Notes (Signed)
MD at bedside. 

## 2015-12-22 NOTE — Discharge Instructions (Signed)

## 2015-12-22 NOTE — ED Provider Notes (Signed)
CSN: [id number]     Arrival date & time 12/22/15  0865 History   First MD Initiated Contact with Patient 12/22/15 1057     Chief Complaint  Patient presents with  . Chest Pain  . Dizziness  . Headache     (Consider location/radiation/quality/duration/timing/severity/associated sxs/prior Treatment) HPI   Tamara [name] is a 40 y.o. female presents for evaluation of sudden onset of a constellation of symptoms including palpitations or syncope, back pain, chest discomfort, lightheadedness and difficulty breathing. Symptoms also completely resolved except lightheadedness. No preceding symptoms, known cause or similar event in the past. She denies other problems in the last days to weeks. She has a reasonably high stress job, as a Investment banker, corporate. She is married, and here with her husband. There are no other known modifying factors.   Past Medical History  Diagnosis Date  . 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 Abnormal Pap smear    Past Surgical History  Procedure Laterality Date  . No past surgeries    . Cesarean section  06/07/2012    Procedure: CESAREAN SECTION;  Surgeon: Mickel Baas, MD;  Location: [facility];  Service: Gynecology;  Laterality: N/A;   Family History  Problem Relation Age of Onset  . Bipolar disorder Mother   . Alcohol abuse Father   . Seizures Maternal Aunt   . Bipolar disorder Maternal Aunt   . Alcohol abuse Paternal Uncle   . Bipolar disorder Maternal Grandmother   . Cancer Maternal Grandfather     prostate and skin  . Alcohol abuse Maternal Grandfather   . Bipolar disorder Maternal Aunt    Social History  Substance Use Topics  . Smoking status: Never Smoker   . Smokeless tobacco: Never Used  . Alcohol Use: No   OB History    Gravida Para Term Preterm AB TAB SAB Ectopic Multiple Living   Review of Systems  All other systems reviewed and are negative.     Allergies   Penicillins  Home Medications   Prior to Admission medications   Medication Sig Start Date End Date Taking? Authorizing Provider  Prenatal Vit-Fe Fumarate-FA (PRENATAL MULTIVITAMIN) TABS Take 1 tablet by mouth daily.    Historical Provider, MD   BP 123/70 mmHg  Pulse 68  Temp(Src) 97.5 F (36.4 C) (Oral)  Resp 18  SpO2 100% Physical Exam  Constitutional: She is oriented to person, place, and time. She appears well-developed and well-nourished.  HENT:  Head: Normocephalic and atraumatic.  Right Ear: External ear normal.  Left Ear: External ear normal.  Eyes: Conjunctivae and EOM are normal. Pupils are equal, round, and reactive to light.  Neck: Normal range of motion and phonation normal. Neck supple.  Cardiovascular: Normal rate, regular rhythm and normal heart sounds.   Pulmonary/Chest: Effort normal and breath sounds normal. No respiratory distress. She exhibits no tenderness and no bony tenderness.  Abdominal: Soft. There is no tenderness.  Musculoskeletal: Normal range of motion.  Back and chest are nontender to palpation.  Neurological: She is alert and oriented to person, place, and time. No cranial nerve deficit or sensory deficit. She exhibits normal muscle tone. Coordination normal.  Skin: Skin is warm, dry and intact.  Psychiatric: She has a normal mood and affect. Her behavior is normal. Judgment and thought content normal.  Nursing note and vitals reviewed.   ED  Course  Procedures (including critical care time)  Medications - No data to display  Patient Vitals for the past 24 hrs:  BP Temp Temp src Pulse Resp SpO2  12/22/15 1005 123/70 mmHg 97.5 F (36.4 C) Oral 68 18 100 %    11:43 AM Reevaluation with update and discussion. After initial assessment and treatment, an updated evaluation reveals a change in clinical status. Findings discussed with patient, husband, all questions answered. Tamara [name] L    Labs Review Labs Reviewed  CBC  BASIC METABOLIC  PANEL  URINALYSIS, ROUTINE W REFLEX MICROSCOPIC (NOT AT [facility])  I-STAT TROPOININ, ED  CBG MONITORING, ED    Imaging Review Dg Chest 2 View  12/22/2015  CLINICAL DATA:  Sharp chest pain extending through the back. Difficulty breathing. EXAM: CHEST - 2 VIEW COMPARISON:  None. FINDINGS: The heart size and mediastinal contours are within normal limits. Both lungs are clear. The visualized skeletal structures are unremarkable. IMPRESSION: Negative two view chest x-ray Electronically Signed   By: Marin Roberts M.D.   On: 12/22/2015 10:40   I have personally reviewed and evaluated these images and lab results as part of my medical decision-making.   EKG Interpretation   Date/Time:  Monday December 22 2015 10:09:40 EST Ventricular Rate:  74 PR Interval:  148 QRS Duration: 79 QT Interval:  408 QTC Calculation: 453 R Axis:   79 Text Interpretation:  Sinus rhythm Baseline wander in lead(s) V1 No old  tracing to compare Confirmed by [facility]  MD, Ebrima Ranta [id number]) on 12/22/2015  11:00:54 AM      MDM   Final diagnoses:  Panic attack    Rapid onset of a constellation of symptoms most likely consistent with a panic attack. A minimal residual symptoms. During evaluation in the ED, and negative ED evaluation. Doubt ACS, PE, pneumonia, or metabolic instability.  Nursing Notes Reviewed/ Care Coordinated Applicable Imaging Reviewed Interpretation of Laboratory Data incorporated into ED treatment  The patient appears reasonably screened and/or stabilized for discharge and I doubt any other medical condition or other [facility] requiring further screening, evaluation, or treatment in the ED at this time prior to discharge.  Plan: Home Medications- usual; Home Treatments- rest; return here if the recommended treatment, does not improve the symptoms; Recommended follow up- PCP prn     Mancel Bale, MD 12/22/15 1144

## 2016-02-28 ENCOUNTER — Encounter (HOSPITAL_COMMUNITY): Payer: Self-pay | Admitting: Vascular Surgery

## 2016-02-28 ENCOUNTER — Emergency Department (HOSPITAL_COMMUNITY): Payer: 59

## 2016-02-28 ENCOUNTER — Emergency Department (HOSPITAL_COMMUNITY)
Admission: EM | Admit: 2016-02-28 | Discharge: 2016-02-28 | Disposition: A | Discharge status: Home / Self Care | Payer: 59 | Attending: Emergency Medicine | Admitting: Emergency Medicine

## 2016-02-28 DIAGNOSIS — Y9389 Activity, other specified: Secondary | ICD-10-CM | POA: Insufficient documentation

## 2016-02-28 DIAGNOSIS — Y92410 Unspecified street and highway as the place of occurrence of the external cause: Secondary | ICD-10-CM | POA: Insufficient documentation

## 2016-02-28 DIAGNOSIS — S161XXA Strain of muscle, fascia and tendon at neck level, initial encounter: Secondary | ICD-10-CM | POA: Insufficient documentation

## 2016-02-28 DIAGNOSIS — S20219A Contusion of unspecified front wall of thorax, initial encounter: Secondary | ICD-10-CM | POA: Insufficient documentation

## 2016-02-28 DIAGNOSIS — Y998 Other external cause status: Secondary | ICD-10-CM | POA: Diagnosis not present

## 2016-02-28 DIAGNOSIS — S199XXA Unspecified injury of neck, initial encounter: Secondary | ICD-10-CM | POA: Diagnosis present

## 2016-02-28 DIAGNOSIS — S20229A Contusion of unspecified back wall of thorax, initial encounter: Secondary | ICD-10-CM | POA: Insufficient documentation

## 2016-02-28 DIAGNOSIS — Z880 Allergy status to penicillin: Secondary | ICD-10-CM | POA: Diagnosis not present

## 2016-02-28 MED ORDER — TRAMADOL HCL 50 MG PO TABS: 50.0000 mg | ORAL_TABLET | Freq: Four times a day (QID) | ORAL | Status: DC | PRN

## 2016-02-28 MED ORDER — TRAMADOL HCL 50 MG PO TABS
50.0000 mg | ORAL_TABLET | Freq: Once | ORAL | Status: AC
  Administered 2016-02-28: 50 mg via ORAL
  Filled 2016-02-28: qty 1
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
## 2016-02-28 NOTE — Discharge Instructions (Signed)
Follow-up with a family doctor or Dr. Roda ShuttersXU with [facility] if not improving

## 2016-09-03 ENCOUNTER — Other Ambulatory Visit: Payer: Self-pay | Admitting: Obstetrics & Gynecology

## 2016-09-03 DIAGNOSIS — Z1231 Encounter for screening mammogram for malignant neoplasm of breast: Secondary | ICD-10-CM

## 2016-09-21 ENCOUNTER — Ambulatory Visit: Payer: 59

## 2016-10-08 ENCOUNTER — Ambulatory Visit
Admission: RE | Admit: 2016-10-08 | Discharge: 2016-10-08 | Disposition: A | Discharge status: Home / Self Care | Payer: 59 | Source: Ambulatory Visit | Attending: Obstetrics & Gynecology | Admitting: Obstetrics & Gynecology

## 2016-10-08 DIAGNOSIS — Z1231 Encounter for screening mammogram for malignant neoplasm of breast: Secondary | ICD-10-CM

## 2017-03-02 ENCOUNTER — Ambulatory Visit: Payer: 59 | Admitting: Family Medicine

## 2017-03-08 ENCOUNTER — Telehealth: Payer: Self-pay | Admitting: *Deleted

## 2017-03-08 NOTE — Telephone Encounter (Signed)
PreVisit Call attempted. Mailbox was full.

## 2017-03-09 ENCOUNTER — Ambulatory Visit (INDEPENDENT_AMBULATORY_CARE_PROVIDER_SITE_OTHER): Payer: 59 | Admitting: Family Medicine

## 2017-03-09 ENCOUNTER — Encounter: Payer: Self-pay | Admitting: Family Medicine

## 2017-03-09 VITALS — BP 110/72 | HR 74 | Temp 97.8°F | Ht 68.0 in | Wt 163.8 lb

## 2017-03-09 DIAGNOSIS — M76821 Posterior tibial tendinitis, right leg: Secondary | ICD-10-CM | POA: Diagnosis not present

## 2017-03-09 DIAGNOSIS — Z13220 Encounter for screening for lipoid disorders: Secondary | ICD-10-CM | POA: Diagnosis not present

## 2017-03-09 DIAGNOSIS — R635 Abnormal weight gain: Secondary | ICD-10-CM | POA: Diagnosis not present

## 2017-03-09 DIAGNOSIS — Z0000 Encounter for general adult medical examination without abnormal findings: Secondary | ICD-10-CM

## 2017-03-09 DIAGNOSIS — Z87891 Personal history of nicotine dependence: Secondary | ICD-10-CM | POA: Insufficient documentation

## 2017-03-09 LAB — LIPID PANEL
Cholesterol: 272 mg/dL — ABNORMAL HIGH (ref 0–200)
HDL: 77.3 mg/dL (ref >39.00)
LDL Cholesterol: 179 mg/dL — ABNORMAL HIGH (ref 0–99)
NonHDL: 194.95
Total CHOL/HDL Ratio: 4
Triglycerides: 79 mg/dL (ref 0.0–149.0)
VLDL: 15.8 mg/dL (ref 0.0–40.0)

## 2017-03-09 LAB — CBC WITH DIFFERENTIAL/PLATELET
Basophils Absolute: 0 10*3/uL (ref 0.0–0.1)
Basophils Relative: 0.8 % (ref 0.0–3.0)
Eosinophils Absolute: 0.1 10*3/uL (ref 0.0–0.7)
Eosinophils Relative: 1.9 % (ref 0.0–5.0)
HCT: 42.8 % (ref 36.0–46.0)
Hemoglobin: 14.3 g/dL (ref 12.0–15.0)
Lymphocytes Relative: 38.1 % (ref 12.0–46.0)
Lymphs Abs: 1.9 10*3/uL (ref 0.7–4.0)
MCHC: 33.4 g/dL (ref 30.0–36.0)
MCV: 91.1 fl (ref 78.0–100.0)
Monocytes Absolute: 0.4 10*3/uL (ref 0.1–1.0)
Monocytes Relative: 7.3 % (ref 3.0–12.0)
Neutro Abs: 2.6 10*3/uL (ref 1.4–7.7)
Neutrophils Relative %: 51.9 % (ref 43.0–77.0)
Platelets: 310 10*3/uL (ref 150.0–400.0)
RBC: 4.69 Mil/uL (ref 3.87–5.11)
RDW: 12.7 % (ref 11.5–15.5)
WBC: 4.9 10*3/uL (ref 4.0–10.5)

## 2017-03-09 LAB — COMPREHENSIVE METABOLIC PANEL
ALT: 12 U/L (ref 0–35)
AST: 20 U/L (ref 0–37)
Albumin: 5 g/dL (ref 3.5–5.2)
Alkaline Phosphatase: 66 U/L (ref 39–117)
BUN: 9 mg/dL (ref 6–23)
CO2: 31 mEq/L (ref 19–32)
Calcium: 9.7 mg/dL (ref 8.4–10.5)
Chloride: 100 mEq/L (ref 96–112)
Creatinine, Ser: 0.73 mg/dL (ref 0.40–1.20)
GFR: 92.61 mL/min (ref >60.00)
Glucose, Bld: 89 mg/dL (ref 70–99)
Potassium: 4.2 mEq/L (ref 3.5–5.1)
Sodium: 137 mEq/L (ref 135–145)
Total Bilirubin: 1.1 mg/dL (ref 0.2–1.2)
Total Protein: 7.8 g/dL (ref 6.0–8.3)

## 2017-03-09 LAB — TSH: TSH: 1.25 u[IU]/mL (ref 0.35–4.50)

## 2017-03-09 MED ORDER — MELOXICAM 15 MG PO TABS: 15.0000 mg | ORAL_TABLET | Freq: Every day | ORAL | 0 refills | Status: DC

## 2017-03-09 NOTE — Progress Notes (Signed)
Tamara [name] is a 41 y.o. female is here to [facility].   History of Present Illness:   Britt Bottom CMA acting as scribe for Dr. Earlene Plater  Ankle Pain   There was no injury mechanism. The pain is present in the right ankle. The pain is mild. Exacerbated by: exercise.   There are no preventive care reminders to display for this patient.  PMHx, SurgHx, SocialHx, Medications, and Allergies were reviewed in the Visit Navigator and updated as appropriate.   Past Medical History:  Diagnosis Date  . Abnormal Pap smear   . Former smoker   . Newborn product of IVF pregnancy     Past Surgical History:  Procedure Laterality Date  . CESAREAN SECTION  06/07/2012   Procedure: CESAREAN SECTION;  Surgeon: Mickel Baas, MD;  Location: [facility];  Service: Gynecology;  Laterality: N/A;    Family History  Problem Relation Age of Onset  . Bipolar disorder Mother   . Alcohol abuse Father   . Seizures Maternal Aunt   . Bipolar disorder Maternal Aunt   . Alcohol abuse Paternal Uncle   . Bipolar disorder Maternal Grandmother   . Alcohol abuse Maternal Grandfather   . Prostate cancer Maternal Grandfather   . Bipolar disorder Maternal Aunt    Social History  Substance Use Topics  . Smoking status: Former Games developer  . Smokeless tobacco: Never Used  . Alcohol use 3.6 oz/week    6 Glasses of wine per week   Current Medications and Allergies:   .  polyvinyl alcohol (LIQUIFILM TEARS) 1.4 % ophthalmic solution, Place 1 drop into both eyes as needed for dry eyes., Disp: , Rfl:   Allergies  Allergen Reactions  . Penicillins Nausea Only   Review of Systems:   Review of Systems  Constitutional: Negative for chills, fever and malaise/fatigue.  HENT: Negative for congestion, ear pain, sinus pain and sore throat.   Eyes: Negative for blurred vision and double vision.  Respiratory: Negative for cough, shortness of breath and wheezing.   Cardiovascular: Negative for chest pain,  palpitations and leg swelling.  Gastrointestinal: Negative for abdominal pain, constipation, diarrhea and vomiting.  Genitourinary: Negative for dysuria.  Musculoskeletal: Negative for back pain, joint pain and neck pain.  Skin: Negative for itching and rash.  Neurological: Negative for dizziness and headaches.  Psychiatric/Behavioral: Negative for depression, hallucinations and memory loss.    Vitals:   Vitals:   03/09/17 0907  BP: 110/72  Pulse: 74  Temp: 97.8 F (36.6 C)  TempSrc: Oral  SpO2: 99%  Weight: 163 lb 12.8 oz (74.3 kg)  Height:  (1.727 m)     Body mass index is 24.91 kg/m.  Physical Exam:   Physical Exam  Constitutional: She appears well-nourished.  HENT:  Head: Normocephalic and atraumatic.  Eyes: EOM are normal. Pupils are equal, round, and reactive to light.  Neck: Normal range of motion. Neck supple.  Cardiovascular: Normal rate, regular rhythm, normal heart sounds and intact distal pulses.   Pulmonary/Chest: Effort normal.  Abdominal: Soft.  Musculoskeletal:       Left ankle: Tenderness. Posterior TFL tenderness found.  Skin: Skin is warm.  Psychiatric: She has a normal mood and affect. Her behavior is normal.  Nursing note and vitals reviewed.  Assessment and Plan:    Kelsei was seen today for establish care.  Diagnoses and all orders for this visit:  Routine physical examination  Posterior tibial tendinitis of right lower extremity -  meloxicam (MOBIC) 15 MG tablet; Take 1 tablet (15 mg total) by mouth daily.  Screening for lipid disorders -     Lipid panel  Weight gain -     Comprehensive metabolic panel -     CBC with Differential/Platelet -     TSH   . Reviewed expectations re: course of current medical issues. . Discussed self-management of symptoms. . Outlined signs and symptoms indicating need for more acute intervention. . Patient verbalized understanding and all questions were answered. . See orders for this  visit as documented in the electronic medical record. . Patient received an After Visit Summary.  CMA served as Neurosurgeon during this visit. History, Physical, and Plan performed by medical provider. Documentation and orders reviewed and attested to. Helane Rima, D.O.  Helane Rima, D.O. [place], [place] 03/09/2017   Follow-up: No Follow-up on file.  Meds ordered this encounter  Medications  . meloxicam (MOBIC) 15 MG tablet    Sig: Take 1 tablet (15 mg total) by mouth daily.    Dispense:  30 tablet    Refill:  0   Medications Discontinued During This Encounter  Medication Reason  . traMADol (ULTRAM) 50 MG tablet Error   Orders Placed This Encounter  Procedures  . Comprehensive metabolic panel  . CBC with Differential/Platelet  . Lipid panel  . TSH

## 2017-03-09 NOTE — Progress Notes (Signed)
Pre visit review using our clinic review tool, if applicable. No additional management support is needed unless otherwise documented below in the visit note. 

## 2017-03-14 NOTE — Progress Notes (Signed)
Notified patient of lab results.  Patient verbalized understanding.  
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
## 2017-09-13 ENCOUNTER — Other Ambulatory Visit: Payer: Self-pay | Admitting: Obstetrics & Gynecology

## 2017-09-13 DIAGNOSIS — Z1231 Encounter for screening mammogram for malignant neoplasm of breast: Secondary | ICD-10-CM

## 2017-10-10 ENCOUNTER — Ambulatory Visit
Admission: RE | Admit: 2017-10-10 | Discharge: 2017-10-10 | Disposition: A | Discharge status: Home / Self Care | Payer: Managed Care, Other (non HMO) | Source: Ambulatory Visit | Attending: Obstetrics & Gynecology | Admitting: Obstetrics & Gynecology

## 2017-10-10 ENCOUNTER — Encounter: Payer: Self-pay | Admitting: Radiology

## 2017-10-10 DIAGNOSIS — Z1231 Encounter for screening mammogram for malignant neoplasm of breast: Secondary | ICD-10-CM

## 2018-09-04 ENCOUNTER — Other Ambulatory Visit: Payer: Self-pay | Admitting: Obstetrics & Gynecology

## 2018-09-04 DIAGNOSIS — Z1231 Encounter for screening mammogram for malignant neoplasm of breast: Secondary | ICD-10-CM

## 2018-10-12 ENCOUNTER — Ambulatory Visit: Payer: Managed Care, Other (non HMO)

## 2018-11-02 ENCOUNTER — Ambulatory Visit
Admission: RE | Admit: 2018-11-02 | Discharge: 2018-11-02 | Disposition: A | Discharge status: Home / Self Care | Payer: Managed Care, Other (non HMO) | Source: Ambulatory Visit | Attending: Obstetrics & Gynecology | Admitting: Obstetrics & Gynecology

## 2018-11-02 ENCOUNTER — Other Ambulatory Visit: Payer: Self-pay | Admitting: Family Medicine

## 2018-11-02 DIAGNOSIS — Z1231 Encounter for screening mammogram for malignant neoplasm of breast: Secondary | ICD-10-CM

## 2019-03-06 ENCOUNTER — Telehealth: Payer: Self-pay | Admitting: Family Medicine

## 2019-03-06 NOTE — Telephone Encounter (Signed)
Pt has not been seen in over a year. Pt is going to call back and schedule Doxy appt for f/u.

## 2019-03-22 ENCOUNTER — Telehealth: Payer: Self-pay | Admitting: Family Medicine

## 2019-03-22 NOTE — Telephone Encounter (Signed)
Patient left voicemail stating that she was told that if she called her primary care doctor she could be told where Corona virus testing could be done. Pt can be called back at [phone].  Returned call to patient and notified patient that unfortunately corona virus testing was not available at this time. Explained to pt the testing was  limited to pt's that have severe symptoms that may require  hospitalization. Pt does not report any symptoms at this time. Pt verbalized understanding and no other concerns voiced at this time.

## 2019-03-23 NOTE — Telephone Encounter (Signed)
Called patient l/m to call office to see if she is having any symptoms and needs to be seen

## 2019-03-23 NOTE — Telephone Encounter (Signed)
See note, [facility] RN returned patients call already and advised. Routing for documentation only.

## 2019-11-29 ENCOUNTER — Other Ambulatory Visit: Payer: Self-pay | Admitting: Physician Assistant

## 2019-11-29 DIAGNOSIS — Z1231 Encounter for screening mammogram for malignant neoplasm of breast: Secondary | ICD-10-CM

## 2019-12-20 ENCOUNTER — Other Ambulatory Visit: Payer: Self-pay

## 2019-12-21 ENCOUNTER — Encounter: Payer: Self-pay | Admitting: Physician Assistant

## 2019-12-21 ENCOUNTER — Ambulatory Visit (INDEPENDENT_AMBULATORY_CARE_PROVIDER_SITE_OTHER): Payer: Managed Care, Other (non HMO) | Admitting: Physician Assistant

## 2019-12-21 VITALS — BP 120/80 | HR 70 | Temp 97.9°F | Ht 68.0 in | Wt 172.0 lb

## 2019-12-21 DIAGNOSIS — Z23 Encounter for immunization: Secondary | ICD-10-CM

## 2019-12-21 DIAGNOSIS — R635 Abnormal weight gain: Secondary | ICD-10-CM

## 2019-12-21 DIAGNOSIS — Z803 Family history of malignant neoplasm of breast: Secondary | ICD-10-CM

## 2019-12-21 DIAGNOSIS — E785 Hyperlipidemia, unspecified: Secondary | ICD-10-CM

## 2019-12-21 DIAGNOSIS — Z0000 Encounter for general adult medical examination without abnormal findings: Secondary | ICD-10-CM | POA: Diagnosis not present

## 2019-12-21 DIAGNOSIS — Z124 Encounter for screening for malignant neoplasm of cervix: Secondary | ICD-10-CM

## 2019-12-21 LAB — HEMOGLOBIN A1C: Hgb A1c MFr Bld: 5.4 % (ref 4.6–6.5)

## 2019-12-21 LAB — LIPID PANEL
Cholesterol: 233 mg/dL — ABNORMAL HIGH (ref 0–200)
HDL: 59.3 mg/dL (ref >39.00)
LDL Cholesterol: 154 mg/dL — ABNORMAL HIGH (ref 0–99)
NonHDL: 173.96
Total CHOL/HDL Ratio: 4
Triglycerides: 99 mg/dL (ref 0.0–149.0)
VLDL: 19.8 mg/dL (ref 0.0–40.0)

## 2019-12-21 LAB — COMPREHENSIVE METABOLIC PANEL
ALT: 14 U/L (ref 0–35)
AST: 16 U/L (ref 0–37)
Albumin: 4.4 g/dL (ref 3.5–5.2)
Alkaline Phosphatase: 59 U/L (ref 39–117)
BUN: 12 mg/dL (ref 6–23)
CO2: 30 mEq/L (ref 19–32)
Calcium: 9.1 mg/dL (ref 8.4–10.5)
Chloride: 102 mEq/L (ref 96–112)
Creatinine, Ser: 0.77 mg/dL (ref 0.40–1.20)
GFR: 80.89 mL/min (ref >60.00)
Glucose, Bld: 82 mg/dL (ref 70–99)
Potassium: 4.4 mEq/L (ref 3.5–5.1)
Sodium: 137 mEq/L (ref 135–145)
Total Bilirubin: 0.7 mg/dL (ref 0.2–1.2)
Total Protein: 6.7 g/dL (ref 6.0–8.3)

## 2019-12-21 LAB — CBC WITH DIFFERENTIAL/PLATELET
Basophils Absolute: 0.1 10*3/uL (ref 0.0–0.1)
Basophils Relative: 0.9 % (ref 0.0–3.0)
Eosinophils Absolute: 0.1 10*3/uL (ref 0.0–0.7)
Eosinophils Relative: 1.1 % (ref 0.0–5.0)
HCT: 40.4 % (ref 36.0–46.0)
Hemoglobin: 13.6 g/dL (ref 12.0–15.0)
Lymphocytes Relative: 30 % (ref 12.0–46.0)
Lymphs Abs: 1.6 10*3/uL (ref 0.7–4.0)
MCHC: 33.6 g/dL (ref 30.0–36.0)
MCV: 91.9 fl (ref 78.0–100.0)
Monocytes Absolute: 0.4 10*3/uL (ref 0.1–1.0)
Monocytes Relative: 7.1 % (ref 3.0–12.0)
Neutro Abs: 3.3 10*3/uL (ref 1.4–7.7)
Neutrophils Relative %: 60.9 % (ref 43.0–77.0)
Platelets: 320 10*3/uL (ref 150.0–400.0)
RBC: 4.39 Mil/uL (ref 3.87–5.11)
RDW: 12.5 % (ref 11.5–15.5)
WBC: 5.4 10*3/uL (ref 4.0–10.5)

## 2019-12-21 NOTE — Patient Instructions (Signed)
It was great to see you!  You will be contacted about your referrals to Gertie Exon (gynecologist) and the genetic counselors.  Please go to the lab for blood work.   Our office will call you with your results unless you have chosen to receive results via MyChart.  If your blood work is normal we will follow-up each year for physicals and as scheduled for chronic medical problems.  If anything is abnormal we will treat accordingly and get you in for a follow-up.  Take care,  [facility] Maintenance, Female Adopting a healthy lifestyle and getting preventive care are important in promoting health and wellness. Ask your health care provider about:  The right schedule for you to have regular tests and exams.  Things you can do on your own to prevent diseases and keep yourself healthy. What should I know about diet, weight, and exercise? Eat a healthy diet   Eat a diet that includes plenty of vegetables, fruits, low-fat dairy products, and lean protein.  Do not eat a lot of foods that are high in solid fats, added sugars, or sodium. Maintain a healthy weight Body mass index (BMI) is used to identify weight problems. It estimates body fat based on height and weight. Your health care provider can help determine your BMI and help you achieve or maintain a healthy weight. Get regular exercise Get regular exercise. This is one of the most important things you can do for your health. Most adults should:  Exercise for at least 150 minutes each week. The exercise should increase your heart rate and make you sweat (moderate-intensity exercise).  Do strengthening exercises at least twice a week. This is in addition to the moderate-intensity exercise.  Spend less time sitting. Even light physical activity can be beneficial. Watch cholesterol and blood lipids Have your blood tested for lipids and cholesterol at 46 years of age, then have this test every 5 years. Have your  cholesterol levels checked more often if:  Your lipid or cholesterol levels are high.  You are older than 46 years of age.  You are at high risk for heart disease. What should I know about cancer screening? Depending on your health history and family history, you may need to have cancer screening at various ages. This may include screening for:  Breast cancer.  Cervical cancer.  Colorectal cancer.  Skin cancer.  Lung cancer. What should I know about heart disease, diabetes, and high blood pressure? Blood pressure and heart disease  High blood pressure causes heart disease and increases the risk of stroke. This is more likely to develop in people who have high blood pressure readings, are of African descent, or are overweight.  Have your blood pressure checked: ? Every 3-5 years if you are 23-43 years of age. ? Every year if you are 31 years old or older. Diabetes Have regular diabetes screenings. This checks your fasting blood sugar level. Have the screening done:  Once every three years after age 3 if you are at a normal weight and have a low risk for diabetes.  More often and at a younger age if you are overweight or have a high risk for diabetes. What should I know about preventing infection? Hepatitis B If you have a higher risk for hepatitis B, you should be screened for this virus. Talk with your health care provider to find out if you are at risk for hepatitis B infection. Hepatitis C Testing is recommended for:  Everyone born from [birth year] through 1967.  Anyone with known risk factors for hepatitis C. Sexually transmitted infections (STIs)  Get screened for STIs, including gonorrhea and chlamydia, if: ? You are sexually active and are younger than 46 years of age. ? You are older than 46 years of age and your health care provider tells you that you are at risk for this type of infection. ? Your sexual activity has changed since you were last screened, and you are  at increased risk for chlamydia or gonorrhea. Ask your health care provider if you are at risk.  Ask your health care provider about whether you are at high risk for HIV. Your health care provider may recommend a prescription medicine to help prevent HIV infection. If you choose to take medicine to prevent HIV, you should first get tested for HIV. You should then be tested every 3 months for as long as you are taking the medicine. Pregnancy  If you are about to stop having your period (premenopausal) and you may become pregnant, seek counseling before you get pregnant.  Take 400 to 800 micrograms (mcg) of folic acid every day if you become pregnant.  Ask for birth control (contraception) if you want to prevent pregnancy. Osteoporosis and menopause Osteoporosis is a disease in which the bones lose minerals and strength with aging. This can result in bone fractures. If you are 89 years old or older, or if you are at risk for osteoporosis and fractures, ask your health care provider if you should:  Be screened for bone loss.  Take a calcium or vitamin D supplement to lower your risk of fractures.  Be given hormone replacement therapy (HRT) to treat symptoms of menopause. Follow these instructions at home: Lifestyle  Do not use any products that contain nicotine or tobacco, such as cigarettes, e-cigarettes, and chewing tobacco. If you need help quitting, ask your health care provider.  Do not use street drugs.  Do not share needles.  Ask your health care provider for help if you need support or information about quitting drugs. Alcohol use  Do not drink alcohol if: ? Your health care provider tells you not to drink. ? You are pregnant, may be pregnant, or are planning to become pregnant.  If you drink alcohol: ? Limit how much you use to 0-1 drink a day. ? Limit intake if you are breastfeeding.  Be aware of how much alcohol is in your drink. In the [place] one drink equals one 12 oz  bottle of beer (355 mL), one 5 oz glass of wine (148 mL), or one 1 oz glass of hard liquor (44 mL). General instructions  Schedule regular health, dental, and eye exams.  Stay current with your vaccines.  Tell your health care provider if: ? You often feel depressed. ? You have ever been abused or do not feel safe at home. Summary  Adopting a healthy lifestyle and getting preventive care are important in promoting health and wellness.  Follow your health care provider's instructions about healthy diet, exercising, and getting tested or screened for diseases.  Follow your health care provider's instructions on monitoring your cholesterol and blood pressure. This information is not intended to replace advice given to you by your health care provider. Make sure you discuss any questions you have with your health care provider. Document Revised: 11/01/2018 Document Reviewed: 11/01/2018 Elsevier Patient Education  2020 [employer].

## 2019-12-21 NOTE — Progress Notes (Signed)
I acted as a Education administrator for [employer], PA-C Anselmo Pickler, LPN   Subjective:    Tamara [name] is a 44 y.o. female and is here for a comprehensive physical exam.   HPI    There are no preventive care reminders to display for this patient.  Acute Concerns: None  Chronic Issues: Weight gain -- reports that she is currently at her highest weight. Has not been as active as she would like to be. Diet is relatively healthy but does admit to eating out for lunch more often than she would like. She drinks 1-2 regular sodas daily. Drinks wine most nights of the week. Family hx breast cancer -- in mother, maternal grandmother, and two maternal aunts. Has never had genetic testing but is interested. UTD on mammograms. HLD -- currently not on medication; due for lipid panel today.  Health Maintenance: Immunizations -- UTD Colonoscopy -- N/A Mammogram -- scheduled for Feb  PAP -- overdue, will refer to GYN, Dr. Sumner Boast Bone Density -- N/A Diet -- variable diet; eats all food groups, does drink sugary beverages, 1-2 sodas a day Sleep habits -- good Exercise -- none Weight -- Weight: 172 lb (78 kg)  Mood -- no issues Weight history: Wt Readings from Last 10 Encounters:  12/21/19 172 lb (78 kg)  03/09/17 163 lb 12.8 oz (74.3 kg)  06/07/12 190 lb (86.2 kg)   Patient's last menstrual period was 07/26/2011. Alcohol use: 6 glasses of wine a week Tobacco use: former user  Depression screen PHQ 2/9 12/21/2019  Decreased Interest 0  Down, Depressed, Hopeless 0  PHQ - 2 Score 0     Other providers/specialists: Patient Care Team: Briscoe Deutscher, DO as PCP - General (Family Medicine)    PMHx, SurgHx, SocialHx, Medications, and Allergies were reviewed in the Visit Navigator and updated as appropriate.   Past Medical History:  Diagnosis Date  . Abnormal Pap smear   . Former smoker   . Newborn product of IVF pregnancy      Past Surgical History:  Procedure Laterality  Date  . CESAREAN SECTION  06/07/2012   Procedure: CESAREAN SECTION;  Surgeon: Sharene Butters, MD;  Location: [facility];  Service: Gynecology;  Laterality: N/A;     Family History  Problem Relation Age of Onset  . Breast cancer Mother   . Depression Mother   . Alcohol abuse Father   . Seizures Maternal Aunt   . Bipolar disorder Maternal Aunt   . Breast cancer Maternal Aunt   . Alcohol abuse Paternal Uncle   . Drug abuse Paternal Uncle   . Breast cancer Maternal Grandmother   . Alzheimer's disease Maternal Grandmother   . Prostate cancer Maternal Grandfather   . Bipolar disorder Maternal Aunt   . Breast cancer Maternal Aunt   . Alcohol abuse Paternal Grandfather   . Colon cancer Neg Hx     Social History   Tobacco Use  . Smoking status: Former Smoker    Quit date: 11/22/2014    Years since quitting: 5.0  . Smokeless tobacco: Never Used  Substance Use Topics  . Alcohol use: Yes    Alcohol/week: 6.0 standard drinks    Types: 6 Glasses of wine per week  . Drug use: No    Review of Systems:   Review of Systems  Constitutional: Negative for chills, fever, malaise/fatigue and weight loss.  HENT: Negative for hearing loss, sinus pain and sore throat.   Respiratory: Negative for cough and  hemoptysis.   Cardiovascular: Negative for chest pain, palpitations, leg swelling and PND.  Gastrointestinal: Negative for abdominal pain, constipation, diarrhea, heartburn, nausea and vomiting.  Genitourinary: Negative for dysuria, frequency and urgency.  Musculoskeletal: Negative for back pain, myalgias and neck pain.  Skin: Negative for itching and rash.  Neurological: Negative for dizziness, tingling, seizures and headaches.  Endo/Heme/Allergies: Negative for polydipsia.  Psychiatric/Behavioral: Negative for depression. The patient is not nervous/anxious.     Objective:   BP 120/80 (BP Location: Left Arm, Patient Position: Sitting, Cuff Size: Normal)   Pulse 70   Temp 97.9 F (36.6  C) (Temporal)   Ht 5\' 8"  (1.727 m)   Wt 172 lb (78 kg)   LMP 07/26/2011   SpO2 98%   Breastfeeding No   BMI 26.15 kg/m  Body mass index is 26.15 kg/m.   General Appearance:    Alert, cooperative, no distress, appears stated age  Head:    Normocephalic, without obvious abnormality, atraumatic  Eyes:    PERRL, conjunctiva/corneas clear, EOM's intact, fundi    benign, both eyes  Ears:    Normal TM's and external ear canals, both ears  Nose:   Nares normal, septum midline, mucosa normal, no drainage    or sinus tenderness  Throat:   Lips, mucosa, and tongue normal; teeth and gums normal  Neck:   Supple, symmetrical, trachea midline, no adenopathy;    thyroid:  no enlargement/tenderness/nodules; no carotid   bruit or JVD  Back:     Symmetric, no curvature, ROM normal, no CVA tenderness  Lungs:     Clear to auscultation bilaterally, respirations unlabored  Chest Wall:    No tenderness or deformity   Heart:    Regular rate and rhythm, S1 and S2 normal, no murmur, rub or gallop  Breast Exam:    Deferred  Abdomen:     Soft, non-tender, bowel sounds active all four quadrants,    no masses, no organomegaly  Genitalia:    Deferred  Extremities:   Extremities normal, atraumatic, no cyanosis or edema  Pulses:   2+ and symmetric all extremities  Skin:   Skin color, texture, turgor normal, no rashes or lesions  Lymph nodes:   Cervical, supraclavicular, and axillary nodes normal  Neurologic:   CNII-XII intact, normal strength, sensation and reflexes    throughout     Assessment/Plan:   Basha was seen today for transfer of care and annual exam.  Diagnoses and all orders for this visit:  Routine physical examination Today patient counseled on age appropriate routine health concerns for screening and prevention, each reviewed and up to date or declined. Immunizations reviewed and up to date or declined. Labs ordered and reviewed. Risk factors for depression reviewed and negative.  Hearing function and visual acuity are intact. ADLs screened and addressed as needed. Functional ability and level of safety reviewed and appropriate. Education, counseling and referrals performed based on assessed risks today. Patient provided with a copy of personalized plan for preventive services.  Hyperlipidemia, unspecified hyperlipidemia type -     Lipid panel  Weight gain Encouraged reduction of sugary beverages and addition of activity. -     CBC with Differential/Platelet -     Comprehensive metabolic panel -     Hemoglobin A1c  Family history of breast cancer -     Ambulatory referral to Genetics  Pap smear for cervical cancer screening -     Ambulatory referral to Obstetrics / Gynecology  Need for immunization against  influenza -     Flu Vaccine QUAD 36+ mos IM    Well Adult Exam: Labs ordered: Yes. Patient counseling was done. See below for items discussed. Discussed the patient's BMI.  The BMI is not in the acceptable range; BMI management plan is completed Follow up as needed for acute illness. Breast cancer screening: scheduled. Cervical cancer screening: referral placed   Patient Counseling: [x]    Nutrition: Stressed importance of moderation in sodium/caffeine intake, saturated fat and cholesterol, caloric balance, sufficient intake of fresh fruits, vegetables, fiber, calcium, iron, and 1 mg of folate supplement per day (for females capable of pregnancy).  [x]    Stressed the importance of regular exercise.   [x]    Substance Abuse: Discussed cessation/primary prevention of tobacco, alcohol, or other drug use; driving or other dangerous activities under the influence; availability of treatment for abuse.   [x]    Injury prevention: Discussed safety belts, safety helmets, smoke detector, smoking near bedding or upholstery.   [x]    Sexuality: Discussed sexually transmitted diseases, partner selection, use of condoms, avoidance of unintended pregnancy  and contraceptive  alternatives.  [x]    Dental health: Discussed importance of regular tooth brushing, flossing, and dental visits.  [x]    Health maintenance and immunizations reviewed. Please refer to Health maintenance section.   CMA or LPN served as scribe during this visit. History, Physical, and Plan performed by medical provider. The above documentation has been reviewed and is accurate and complete.   , PA-C  [facility]

## 2019-12-25 ENCOUNTER — Encounter: Payer: Self-pay | Admitting: Obstetrics and Gynecology

## 2019-12-25 ENCOUNTER — Telehealth: Payer: Self-pay | Admitting: Genetic Counselor

## 2019-12-25 NOTE — Telephone Encounter (Signed)
A mychart video visit has been scheduled for Ms. Tamara [name] to see Clydie Braun on 2/8 at 10am. She has an active mychart acct but needs to change her pwd bc she couldn't remember it. I also verified her email address and phone number. She's been made aware to login 10 minutes prior to ensure her audio and camera work.

## 2019-12-28 ENCOUNTER — Telehealth: Payer: Self-pay | Admitting: Genetic Counselor

## 2019-12-28 NOTE — Telephone Encounter (Signed)
Left message to verify mychart visit for pre reg °

## 2019-12-31 ENCOUNTER — Encounter: Payer: Self-pay | Admitting: Genetic Counselor

## 2019-12-31 ENCOUNTER — Inpatient Hospital Stay: Payer: Managed Care, Other (non HMO) | Attending: Genetic Counselor | Admitting: Genetic Counselor

## 2019-12-31 DIAGNOSIS — Z803 Family history of malignant neoplasm of breast: Secondary | ICD-10-CM | POA: Diagnosis not present

## 2019-12-31 NOTE — Progress Notes (Signed)
REFERRING PROVIDER: Inda Coke, [place],   [id number]  PRIMARY PROVIDER:  Inda Coke, [place]  PRIMARY REASON FOR VISIT:  1. Family history of breast cancer      HISTORY OF PRESENT ILLNESS:  I connected with  Tamara [name] on 12/31/2019 at 10 AM EDT by MyChart video conference and verified that I am speaking with the correct person using two identifiers.   Patient location: Work Provider location: [place]  Tamara [name], a 44 y.o. female, was seen for a [facility] cancer genetics consultation at the request of Dr. Morene Rankins due to a family history of breast cancer.  Tamara [name] presents to clinic today to discuss the possibility of a hereditary predisposition to cancer, genetic testing, and to further clarify her future cancer risks, as well as potential cancer risks for family members.   Tamara [name] is a 44 y.o. female with no personal history of cancer.  Her mother and maternal aunt reportedly had genetic testing that was negative.  CANCER HISTORY:  Oncology History   No history exists.     RISK FACTORS:  Menarche was at age 60.  First live birth at age 16.  Ovaries intact: yes.  Hysterectomy: no.  Menopausal status: postmenopausal.  HRT use: 0 years. Colonoscopy: no; not examined. Mammogram within the last year: yes. Number of breast biopsies: 0. Up to date with pelvic exams: yes. Any excessive radiation exposure in the past: no  Past Medical History:  Diagnosis Date  . Abnormal Pap smear   . Family history of breast cancer   . Former smoker   . Newborn product of IVF pregnancy     Past Surgical History:  Procedure Laterality Date  . CESAREAN SECTION  06/07/2012   Procedure: CESAREAN SECTION;  Surgeon: Sharene Butters, MD;  Location: [facility];  Service: Gynecology;  Laterality: N/A;    Social History   Socioeconomic History  . Marital status: Married    Spouse name: Not on file  . Number of children: 1  . Years of education: Not on file  .  Highest education level: Not on file  Occupational History    Employer: [employer]  Tobacco Use  . Smoking status: Former Smoker    Quit date: 11/22/2014    Years since quitting: 5.1  . Smokeless tobacco: Never Used  Substance and Sexual Activity  . Alcohol use: Yes    Alcohol/week: 6.0 standard drinks    Types: 6 Glasses of wine per week  . Drug use: No  . Sexual activity: Yes  Other Topics Concern  . Not on file  Social History Narrative   Secondary school teacher   Married   72 yo son   New puppy   Social Determinants of Health   Financial Resource Strain:   . Difficulty of Paying Living Expenses: Not on file  Food Insecurity:   . Worried About Charity fundraiser in the Last Year: Not on file  . Ran Out of Food in the Last Year: Not on file  Transportation Needs:   . Lack of Transportation (Medical): Not on file  . Lack of Transportation (Non-Medical): Not on file  Physical Activity:   . Days of Exercise per Week: Not on file  . Minutes of Exercise per Session: Not on file  Stress:   . Feeling of Stress : Not on file  Social Connections:   . Frequency of Communication with Friends and Family: Not on file  . Frequency  of Social Gatherings with Friends and Family: Not on file  . Attends Religious Services: Not on file  . Active Member of Clubs or Organizations: Not on file  . Attends Archivist Meetings: Not on file  . Marital Status: Not on file     FAMILY HISTORY:  We obtained a detailed, 4-generation family history.  Significant diagnoses are listed below: Family History  Problem Relation Age of Onset  . Breast cancer Mother 23       second cancer at 89  . Depression Mother   . Alcohol abuse Father   . Cancer Father        unknown  . Seizures Maternal Aunt   . Bipolar disorder Maternal Aunt   . Breast cancer Maternal Aunt 43       second dx 79  . Alcohol abuse Paternal Uncle   . Drug abuse Paternal Uncle   . Breast cancer Maternal  Grandmother 21  . Alzheimer's disease Maternal Grandmother   . Dementia Maternal Grandmother   . Prostate cancer Maternal Grandfather   . Bipolar disorder Maternal Aunt   . Breast cancer Maternal Aunt 52  . Alcohol abuse Paternal Grandfather   . Throat cancer Maternal Uncle   . Breast cancer Other        MGMs mother  . Prostate cancer Other        MGFs brother  . Colon cancer Neg Hx     The patient has one son, conceived via IVF and egg donation, who is cancer free.  She has one brother who is cancer free.  Her parents are living.  The patient's mother had breast cancer at 55 and 72 and reportedly had negative genetic testing.  Her mother had a brother and two sisters. Both sisters had breast cancer.  One sister had breast cancer at 32 and 24 and the other had breast cancer at 20.  The maternal grandmother had breast cancer around 14 and the grandfather had prostate cancer.  The grandmother's mother had breast cancer and the grandfather's brother had prostate cancer.  The patient's father is estranged.  She thinks he had cancer but does not know the type.  He has a brother and a sister who she thinks are cancer free.   Tamara [name] is aware of previous family history of genetic testing for hereditary cancer risks. Patient's maternal ancestors are of [place], [place], [place] descent, and paternal ancestors are of Caucasian descent. There is no reported Ashkenazi Jewish ancestry. There is no known consanguinity.   GENETIC COUNSELING ASSESSMENT: Tamara [name] is a 44 y.o. female with a family history of breast and prostate cancer which is somewhat suggestive of a hereditary cancer syndrome and predisposition to cancer given the number of women in the family with breast cancer and their ages of onset. We, therefore, discussed and recommended the following at today's visit.   DISCUSSION: We discussed that 5 - 10% of breast cancer is hereditary, with most cases associated with BRCA mutations.  There are  other genes that can be associated with hereditary breast cancer syndromes.  These include ATM, CHEK2 and PALB2.  Genetic testing can be performed through DNA sequencing as well as looking at RNA.  RNA can make a difference in about 1 in 40 individuals, which includes finding hereditary mutations in ares of the gene DNA does not look, as well as clarifying VUS results.  We discussed that testing is beneficial for several reasons including knowing how to follow individuals  after completing their treatment, identifying whether potential treatment options such as PARP inhibitors would be beneficial, and understand if other family members could be at risk for cancer and allow them to undergo genetic testing.   We reviewed the characteristics, features and inheritance patterns of hereditary cancer syndromes. We also discussed genetic testing, including the appropriate family members to test, the process of testing, insurance coverage and turn-around-time for results. We discussed the implications of a negative, positive, carrier and/or variant of uncertain significant result. We recommended Tamara [name] pursue genetic testing for the CancerNext-Expanded+RNAinsight gene panel. The CancerNext-Expanded gene panel offered by [facility] and includes sequencing and rearrangement analysis for the following 77 genes: AIP, ALK, APC*, ATM*, AXIN2, BAP1, BARD1, BLM, BMPR1A, BRCA1*, BRCA2*, BRIP1*, CDC73, CDH1*, CDK4, CDKN1B, CDKN2A, CHEK2*, CTNNA1, DICER1, FANCC, FH, FLCN, GALNT12, KIF1B, LZTR1, MAX, MEN1, MET, MLH1*, MSH2*, MSH3, MSH6*, MUTYH*, NBN, NF1*, NF2, NTHL1, PALB2*, PHOX2B, PMS2*, POT1, PRKAR1A, PTCH1, PTEN*, RAD51C*, RAD51D*, RB1, RECQL, RET, SDHA, SDHAF2, SDHB, SDHC, SDHD, SMAD4, SMARCA4, SMARCB1, SMARCE1, STK11, SUFU, TMEM127, TP53*, TSC1, TSC2, VHL and XRCC2 (sequencing and deletion/duplication); EGFR, EGLN1, HOXB13, KIT, MITF, PDGFRA, POLD1, and POLE (sequencing only); EPCAM and GREM1 (deletion/duplication only).  DNA and RNA analyses performed for * genes.   Based on Tamara [name]'s family history of cancer, she meets medical criteria for genetic testing. Despite that she meets criteria, she may still have an out of pocket cost. We discussed that if her out of pocket cost for testing is over $100, the laboratory will call and confirm whether she wants to proceed with testing.  If the out of pocket cost of testing is less than $100 she will be billed by the genetic testing laboratory.   We discussed that some people do not want to undergo genetic testing due to fear of genetic discrimination.  A federal law called the Genetic Information Non-Discrimination Act (GINA) of 2008 helps protect individuals against genetic discrimination based on their genetic test results.  It impacts both health insurance and employment.  With health insurance, it protects against increased premiums, being kicked off insurance or being forced to take a test in order to be insured.  For employment it protects against hiring, firing and promoting decisions based on genetic test results.  Health status due to a cancer diagnosis is not protected under GINA.  Based on the patient's family history, a statistical model (Tyrer Cusik) was used to estimate her risk of developing breast cancer. This estimates her lifetime risk of developing breast cancer to be approximately 25.7%. This estimation does not consider any genetic testing results.  The patient's lifetime breast cancer risk is a preliminary estimate based on available information using one of several models endorsed by the [place] (ACS). The ACS recommends consideration of breast MRI screening as an adjunct to mammography for patients at high risk (defined as 20% or greater lifetime risk).   Tamara [name] has been determined to be at high risk for breast cancer.  Therefore, we recommend that annual screening with mammography and breast MRI be performed.  We discussed that Tamara [name]  should discuss her individual situation with her referring physician and determine a breast cancer screening plan with which they are both comfortable.      PLAN: After considering the risks, benefits, and limitations, Tamara [name] provided informed consent to pursue genetic testing.  Mobile Phlebotomy will be ordered and an appointment will be set up at her convenience.  Once an appointment is made, the blood  sample will be sent to [facility] for analysis of the CancerNext-Expanded+RNAinsight. Results should be available within approximately 2-3 weeks' time, at which point they will be disclosed by telephone to Tamara [name], as will any additional recommendations warranted by these results. Tamara [name] will receive a summary of her genetic counseling visit and a copy of her results once available. This information will also be available in Epic.   Lastly, we encouraged Ms. Callies to remain in contact with cancer genetics annually so that we can continuously update the family history and inform her of any changes in cancer genetics and testing that may be of benefit for this family.   Ms. Gunn questions were answered to her satisfaction today. Our contact information was provided should additional questions or concerns arise. Thank you for the referral and allowing [place] to share in the care of your patient.   Tamara [name], [place], [facility] Licensed, Insurance risk surveyor [email or web address] phone: [phone]  The patient was seen for a total of 4 5 minutes in face-to-face genetic counseling.  This patient was discussed with Drs. Magrinat, Lindi Adie and/or Burr Medico who agrees with the above.    _______________________________________________________________________ For Office Staff:  Number of people involved in session: 1 Was an Intern/ student involved with case: no

## 2020-01-08 ENCOUNTER — Ambulatory Visit
Admission: RE | Admit: 2020-01-08 | Discharge: 2020-01-08 | Disposition: A | Discharge status: Home / Self Care | Payer: Managed Care, Other (non HMO) | Source: Ambulatory Visit | Attending: Physician Assistant | Admitting: Physician Assistant

## 2020-01-08 ENCOUNTER — Other Ambulatory Visit: Payer: Self-pay

## 2020-01-08 DIAGNOSIS — Z1231 Encounter for screening mammogram for malignant neoplasm of breast: Secondary | ICD-10-CM

## 2020-01-29 ENCOUNTER — Telehealth: Payer: Self-pay | Admitting: Genetic Counselor

## 2020-01-29 ENCOUNTER — Encounter: Payer: Self-pay | Admitting: Genetic Counselor

## 2020-01-29 DIAGNOSIS — Z1379 Encounter for other screening for genetic and chromosomal anomalies: Secondary | ICD-10-CM | POA: Insufficient documentation

## 2020-01-29 NOTE — Telephone Encounter (Signed)
Revealed negative genetic testing.  Discussed that we do not know why there is cancer in the family. It could be due to a different gene that we are not testing, or maybe our current technology may not be able to pick something up.  It will be important for her to keep in contact with genetics to keep up with whether additional testing may be needed.   KIT VUS identified.  This will not change medical management.

## 2020-01-29 NOTE — Telephone Encounter (Signed)
LM on VM that results are back and to please call. 

## 2020-01-30 ENCOUNTER — Ambulatory Visit: Payer: Self-pay | Admitting: Genetic Counselor

## 2020-01-30 DIAGNOSIS — Z1379 Encounter for other screening for genetic and chromosomal anomalies: Secondary | ICD-10-CM

## 2020-01-30 NOTE — Progress Notes (Addendum)
HPI:  Tamara [name] was previously seen in the [place] clinic due to a family history of breast cancer and concerns regarding a hereditary predisposition to cancer. Please refer to our prior cancer genetics clinic note for more information regarding our discussion, assessment and recommendations, at the time. Tamara [name] recent genetic test results were disclosed to her, as were recommendations warranted by these results. These results and recommendations are discussed in more detail below.  CANCER HISTORY:  Oncology History   No history exists.    FAMILY HISTORY:  We obtained a detailed, 4-generation family history.  Significant diagnoses are listed below: Family History  Problem Relation Age of Onset  . Breast cancer Mother 16       second cancer at 69  . Depression Mother   . Alcohol abuse Father   . Cancer Father        unknown  . Seizures Maternal Aunt   . Bipolar disorder Maternal Aunt   . Breast cancer Maternal Aunt 8       second dx 86  . Alcohol abuse Paternal Uncle   . Drug abuse Paternal Uncle   . Breast cancer Maternal Grandmother 71  . Alzheimer's disease Maternal Grandmother   . Dementia Maternal Grandmother   . Prostate cancer Maternal Grandfather   . Bipolar disorder Maternal Aunt   . Breast cancer Maternal Aunt 52  . Alcohol abuse Paternal Grandfather   . Throat cancer Maternal Uncle   . Breast cancer Other        MGMs mother  . Prostate cancer Other        MGFs brother  . Colon cancer Neg Hx     The patient has one son, conceived via IVF and egg donation, who is cancer free.  She has one brother who is cancer free.  Her parents are living.  The patient's mother had breast cancer at 18 and 66 and reportedly had negative genetic testing.  Her mother had a brother and two sisters. Both sisters had breast cancer.  One sister had breast cancer at 70 and 22 and the other had breast cancer at 59.  The maternal grandmother had breast cancer around 50  and the grandfather had prostate cancer.  The grandmother's mother had breast cancer and the grandfather's brother had prostate cancer.  The patient's father is estranged.  She thinks he had cancer but does not know the type.  He has a brother and a sister who she thinks are cancer free.   Tamara [name] is aware of previous family history of genetic testing for hereditary cancer risks. Patient's maternal ancestors are of [place], [place], [place] descent, and paternal ancestors are of Caucasian descent. There is no reported Ashkenazi Jewish ancestry. There is no known consanguinity.    GENETIC TEST RESULTS: Genetic testing reported out on January 28, 2020 through the CancerNext-Expanded+RNAinsight cancer panel found no pathogenic mutations. The CancerNext-Expanded gene panel offered by [facility] and includes sequencing and rearrangement analysis for the following 77 genes: AIP, ALK, APC*, ATM*, AXIN2, BAP1, BARD1, BLM, BMPR1A, BRCA1*, BRCA2*, BRIP1*, CDC73, CDH1*, CDK4, CDKN1B, CDKN2A, CHEK2*, CTNNA1, DICER1, FANCC, FH, FLCN, GALNT12, KIF1B, LZTR1, MAX, MEN1, MET, MLH1*, MSH2*, MSH3, MSH6*, MUTYH*, NBN, NF1*, NF2, NTHL1, PALB2*, PHOX2B, PMS2*, POT1, PRKAR1A, PTCH1, PTEN*, RAD51C*, RAD51D*, RB1, RECQL, RET, SDHA, SDHAF2, SDHB, SDHC, SDHD, SMAD4, SMARCA4, SMARCB1, SMARCE1, STK11, SUFU, TMEM127, TP53*, TSC1, TSC2, VHL and XRCC2 (sequencing and deletion/duplication); EGFR, EGLN1, HOXB13, KIT, MITF, PDGFRA, POLD1, and POLE (sequencing only);  EPCAM and GREM1 (deletion/duplication only). DNA and RNA analyses performed for * genes. The test report has been scanned into EPIC and is located under the Molecular Pathology section of the Results Review tab.  A portion of the result report is included below for reference.     We discussed with Tamara [name] that because current genetic testing is not perfect, it is possible there may be a gene mutation in one of these genes that current testing cannot detect, but that  chance is small.  We also discussed, that there could be another gene that has not yet been discovered, or that we have not yet tested, that is responsible for the cancer diagnoses in the family. It is also possible there is a hereditary cause for the cancer in the family that Tamara [name] did not inherit and therefore was not identified in her testing.  Therefore, it is important to remain in touch with cancer genetics in the future so that we can continue to offer Tamara [name] the most up to date genetic testing.   Genetic testing did identify a variant of uncertain significance (VUS) was identified in the KIT gene called [id number].  At this time, it is unknown if this variant is associated with increased cancer risk or if this is a normal finding, but most variants such as this get reclassified to being inconsequential. It should not be used to make medical management decisions. With time, we suspect the lab will determine the significance of this variant, if any. If we do learn more about it, we will try to contact Tamara [name] to discuss it further. However, it is important to stay in touch with [place] periodically and keep the address and phone number up to date.  ADDITIONAL GENETIC TESTING: We discussed with Tamara [name] that her genetic testing was fairly extensive.  If there are genes identified to increase cancer risk that can be analyzed in the future, we would be happy to discuss and coordinate this testing at that time.    CANCER SCREENING RECOMMENDATIONS: Tamara [name] test result is considered negative (normal).  This means that we have not identified a hereditary cause for her family history of breast cancer at this time. Most cancers happen by chance and this negative test suggests that her cancer may fall into this category.    While reassuring, this does not definitively rule out a hereditary predisposition to cancer. It is still possible that there could be genetic mutations that are undetectable by current  technology. There could be genetic mutations in genes that have not been tested or identified to increase cancer risk.  Therefore, it is recommended she continue to follow the cancer management and screening guidelines provided by her primary healthcare provider.   An individual's cancer risk and medical management are not determined by genetic test results alone. Overall cancer risk assessment incorporates additional factors, including personal medical history, family history, and any available genetic information that may result in a personalized plan for cancer prevention and surveillance.  Based on the patient's family history, a statistical model (Tyrer Cusik) was used to estimate her risk of developing breast cancer. This estimates her lifetime risk of developing breast cancer to be approximately 25.7%. This estimation does not consider any genetic testing results.  The patient's lifetime breast cancer risk is a preliminary estimate based on available information using one of several models endorsed by the [employer] (ACS). The ACS recommends consideration of breast MRI screening as an adjunct to  mammography for patients at high risk (defined as 20% or greater lifetime risk).   Tamara [name] has been determined to be at high risk for breast cancer. Therefore, we recommend that annual screening with mammography and breast MRI be performed.  We discussed that Tamara [name] should discuss her individual situation with her referring physician and determine a breast cancer screening plan with which they are both comfortable.     RECOMMENDATIONS FOR FAMILY MEMBERS:  Individuals in this family might be at some increased risk of developing cancer, over the general population risk, simply due to the family history of cancer.  We recommended women in this family have a yearly mammogram beginning at age 63, or 55 years younger than the earliest onset of cancer, an annual clinical breast exam, and  perform monthly breast self-exams. Women in this family should also have a gynecological exam as recommended by their primary provider. All family members should have a colonoscopy by age 74.  FOLLOW-UP: Lastly, we discussed with Tamara [name] that cancer genetics is a rapidly advancing field and it is possible that new genetic tests will be appropriate for her and/or her family members in the future. We encouraged her to remain in contact with cancer genetics on an annual basis so we can update her personal and family histories and let her know of advances in cancer genetics that may benefit this family.   Our contact number was provided. Tamara [name] questions were answered to her satisfaction, and she knows she is welcome to call us at anytime with additional questions or concerns.   Roma Kayser, [place], [facility] Licensed, Certified Genetic Counselor [email or web address]

## 2020-02-07 ENCOUNTER — Encounter: Payer: Self-pay | Admitting: Obstetrics and Gynecology

## 2020-02-07 ENCOUNTER — Other Ambulatory Visit: Payer: Self-pay

## 2020-02-07 ENCOUNTER — Ambulatory Visit (INDEPENDENT_AMBULATORY_CARE_PROVIDER_SITE_OTHER): Payer: Managed Care, Other (non HMO) | Admitting: Obstetrics and Gynecology

## 2020-02-07 VITALS — BP 122/66 | HR 88 | Temp 97.3°F | Ht 67.5 in | Wt 169.0 lb

## 2020-02-07 DIAGNOSIS — Z9189 Other specified personal risk factors, not elsewhere classified: Secondary | ICD-10-CM | POA: Diagnosis not present

## 2020-02-07 DIAGNOSIS — Z124 Encounter for screening for malignant neoplasm of cervix: Secondary | ICD-10-CM

## 2020-02-07 DIAGNOSIS — E2839 Other primary ovarian failure: Secondary | ICD-10-CM

## 2020-02-07 DIAGNOSIS — N952 Postmenopausal atrophic vaginitis: Secondary | ICD-10-CM | POA: Diagnosis not present

## 2020-02-07 DIAGNOSIS — E559 Vitamin D deficiency, unspecified: Secondary | ICD-10-CM

## 2020-02-07 DIAGNOSIS — Z01419 Encounter for gynecological examination (general) (routine) without abnormal findings: Secondary | ICD-10-CM

## 2020-02-07 NOTE — Patient Instructions (Signed)
EXERCISE AND DIET:  We recommended that you start or continue a regular exercise program for good health. Regular exercise means any activity that makes your heart beat faster and makes you sweat.  We recommend exercising at least 30 minutes per day at least 3 days a week, preferably 4 or 5.  We also recommend a diet low in fat and sugar.  Inactivity, poor dietary choices and obesity can cause diabetes, heart attack, stroke, and kidney damage, among others.    ALCOHOL AND SMOKING:  Women should limit their alcohol intake to no more than 7 drinks/beers/glasses of wine (combined, not each!) per week. Moderation of alcohol intake to this level decreases your risk of breast cancer and liver damage. And of course, no recreational drugs are part of a healthy lifestyle.  And absolutely no smoking or even second hand smoke. Most people know smoking can cause heart and lung diseases, but did you know it also contributes to weakening of your bones? Aging of your skin?  Yellowing of your teeth and nails?  CALCIUM AND VITAMIN D:  Adequate intake of calcium and Vitamin D are recommended.  The recommendations for exact amounts of these supplements seem to change often, but generally speaking 1,200 mg of calcium (between diet and supplement) and 800 units of Vitamin D per day seems prudent. Certain women may benefit from higher intake of Vitamin D.  If you are among these women, your doctor will have told you during your visit.    PAP SMEARS:  Pap smears, to check for cervical cancer or precancers,  have traditionally been done yearly, although recent scientific advances have shown that most women can have pap smears less often.  However, every woman still should have a physical exam from her gynecologist every year. It will include a breast check, inspection of the vulva and vagina to check for abnormal growths or skin changes, a visual exam of the cervix, and then an exam to evaluate the size and shape of the uterus and  ovaries.  And after 46 years of age, a rectal exam is indicated to check for rectal cancers. We will also provide age appropriate advice regarding health maintenance, like when you should have certain vaccines, screening for sexually transmitted diseases, bone density testing, colonoscopy, mammograms, etc.   MAMMOGRAMS:  All women over 40 years old should have a yearly mammogram. Many facilities now offer a "3D" mammogram, which may cost around $50 extra out of pocket. If possible,  we recommend you accept the option to have the 3D mammogram performed.  It both reduces the number of women who will be called back for extra views which then turn out to be normal, and it is better than the routine mammogram at detecting truly abnormal areas.    COLON CANCER SCREENING: Now recommend starting at age 45. At this time colonoscopy is not covered for routine screening until 50. There are take home tests that can be done between 45-49.   COLONOSCOPY:  Colonoscopy to screen for colon cancer is recommended for all women at age 50.  We know, you hate the idea of the prep.  We agree, BUT, having colon cancer and not knowing it is worse!!  Colon cancer so often starts as a polyp that can be seen and removed at colonscopy, which can quite literally save your life!  And if your first colonoscopy is normal and you have no family history of colon cancer, most women don't have to have it again for   10 years.  Once every ten years, you can do something that may end up saving your life, right?  We will be happy to help you get it scheduled when you are ready.  Be sure to check your insurance coverage so you understand how much it will cost.  It may be covered as a preventative service at no cost, but you should check your particular policy.      Breast Self-Awareness Breast self-awareness means being familiar with how your breasts look and feel. It involves checking your breasts regularly and reporting any changes to your  health care provider. Practicing breast self-awareness is important. A change in your breasts can be a sign of a serious medical problem. Being familiar with how your breasts look and feel allows you to find any problems early, when treatment is more likely to be successful. All women should practice breast self-awareness, including women who have had breast implants. How to do a breast self-exam One way to learn what is normal for your breasts and whether your breasts are changing is to do a breast self-exam. To do a breast self-exam: Look for Changes  1. Remove all the clothing above your waist. 2. Stand in front of a mirror in a room with good lighting. 3. Put your hands on your hips. 4. Push your hands firmly downward. 5. Compare your breasts in the mirror. Look for differences between them (asymmetry), such as: ? Differences in shape. ? Differences in size. ? Puckers, dips, and bumps in one breast and not the other. 6. Look at each breast for changes in your skin, such as: ? Redness. ? Scaly areas. 7. Look for changes in your nipples, such as: ? Discharge. ? Bleeding. ? Dimpling. ? Redness. ? A change in position. Feel for Changes Carefully feel your breasts for lumps and changes. It is best to do this while lying on your back on the floor and again while sitting or standing in the shower or tub with soapy water on your skin. Feel each breast in the following way:  Place the arm on the side of the breast you are examining above your head.  Feel your breast with the other hand.  Start in the nipple area and make  inch (2 cm) overlapping circles to feel your breast. Use the pads of your three middle fingers to do this. Apply light pressure, then medium pressure, then firm pressure. The light pressure will allow you to feel the tissue closest to the skin. The medium pressure will allow you to feel the tissue that is a little deeper. The firm pressure will allow you to feel the tissue  close to the ribs.  Continue the overlapping circles, moving downward over the breast until you feel your ribs below your breast.  Move one finger-width toward the center of the body. Continue to use the  inch (2 cm) overlapping circles to feel your breast as you move slowly up toward your collarbone.  Continue the up and down exam using all three pressures until you reach your armpit.  Write Down What You Find  Write down what is normal for each breast and any changes that you find. Keep a written record with breast changes or normal findings for each breast. By writing this information down, you do not need to depend only on memory for size, tenderness, or location. Write down where you are in your menstrual cycle, if you are still menstruating. If you are having trouble noticing differences   in your breasts, do not get discouraged. With time you will become more familiar with the variations in your breasts and more comfortable with the exam. How often should I examine my breasts? Examine your breasts every month. If you are breastfeeding, the best time to examine your breasts is after a feeding or after using a breast pump. If you menstruate, the best time to examine your breasts is 5-7 days after your period is over. During your period, your breasts are lumpier, and it may be more difficult to notice changes. When should I see my health care provider? See your health care provider if you notice:  A change in shape or size of your breasts or nipples.  A change in the skin of your breast or nipples, such as a reddened or scaly area.  Unusual discharge from your nipples.  A lump or thick area that was not there before.  Pain in your breasts.  Anything that concerns you.  Primary Ovarian Insufficiency  Primary ovarian insufficiency is a condition in which the ovaries stop working in women under age 27. The ovaries have a fixed number of eggs, which are stored in fluid-filled sacs  (follicles). They also produce the female sex hormones, including estrogen. After puberty, female hormones trigger the release of an egg from a follicle (ovulation) each month. If the egg does not get fertilized by a sperm, a woman will have a menstrual period. Throughout life, the number of follicles in the ovaries slowly declines. A condition called menopause occurs when there are no follicles left. When this occurs, ovulation stops and the level of estrogen drops. This is a natural process and occurs in all women by about age 25. If you have primary ovarian failure, the loss of follicles and ability to produce estrogen occurs at a much younger age. What are the causes? In most cases, the exact cause of this condition is not known. Some known causes include:  Not having enough follicles. You are born with a certain number of follicles. You may not have enough to last 40 or more years.  Cancer treatments that damage follicles. These include medicines (chemotherapy), high energy waves (radiation), or surgery.  Follicles that do not respond to the brain hormone that tells follicles to release eggs (follicle-stimulating hormone or FSH).  Having a genetic disorder that causes abnormal ovaries (Turner syndrome and fragile X syndrome).  Having a condition in which your immune system attacks your ovaries (autoimmune disease). What increases the risk? You are more likely to develop this condition if:  You smoke.  You have diabetes.  You have a family history of primary ovarian insufficiency. What are the symptoms? Symptoms of this condition include:  Inability to get pregnant.  Irregular or missed periods.  Other symptoms can be caused by low levels of estrogen. They include: ? Night sweats. ? Hot flashes. ? Dry vagina, which can cause pain during sex. ? Loss of interest in sex (low libido). ? Irritability. ? Trouble sleeping. ? Confusion. ? Thinning bones (osteoporosis), which may  cause bones to break easily. In many cases, there are no symptoms for this condition. How is this diagnosed? This condition may be diagnosed based on:  Your symptoms. Your health care provider will ask you questions about your symptoms. He or she may suspect primary ovarian insufficiency if you have trouble getting pregnant or if you have irregular or missed periods.  A physical exam.  Blood tests. This is done to confirm the diagnosis. The  test will check for: ? Low levels of estrogen. ? High levels of FSH. Follicle loss and low levels of estrogen will cause your brain to release more FSH. ? Low levels of anti-Mullerian hormone (AMH). As follicles are lost, this hormone also decreases. If you have low estrogen associated with low AMH and high FSH, your health care provider may do more tests to look for a possible cause of your primary ovarian insufficiency. These may include genetic testing and testing for autoimmune disease. How is this treated? This condition is treated with hormone replacement therapy, which replaces the female hormones estrogen and progesterone. These hormones may be given as oral medicines or as a skin patch. This treatment may:  Relieve the symptoms of estrogen deficiency.  Prevent osteoporosis.  Protect from heart disease. Treatment often goes on until around age 57 when menopause usually occurs. There is no treatment that can fully restore fertility. However, a small number of women can get pregnant after being diagnosed with this condition. If you desire to get pregnant now or in the future, talk to a fertility specialist right away about your options. Follow these instructions at home:   Take over-the-counter and prescription medicines only as told by your health care provider.  Ask your health care provider about the risks and side effects of hormone replacement therapy.  Eat foods rich in calcium and vitamin D. These include milk, other dairy products, and  orange juice or breakfast cereals that have vitamin D added (fortified). These may help to prevent osteoporosis.  Do exercise regularly. This can make your muscles and bones stronger. Ask your health care provider which activities are safe for you.  Do not use any products that contain nicotine or tobacco, such as cigarettes and e-cigarettes. If you need help quitting, ask your health care provider.  Keep all follow-up visits as told by your health care provider. This is important. Contact a health care provider if:  You have symptoms of low estrogen.  Your periods stop or are not regular.  You have side effects from your medicines. Get help right away if:  You break (fracture) a bone.  You have chest pain or trouble breathing. Summary  Primary ovarian insufficiency results when your ovaries stop working normally before age 2.  In most cases, the exact cause of this condition is not known.  The first sign of this disorder may be difficulty getting pregnant.  Symptoms of this condition include irregular or missed periods and symptoms of low estrogen.  This condition is treated with hormone replacement therapy. This reduces symptoms of low estrogen and protects from osteoporosis. This information is not intended to replace advice given to you by your health care provider. Make sure you discuss any questions you have with your health care provider. Document Revised: 03/02/2019 Document Reviewed: 02/15/2018 Elsevier Patient Education  2020 [employer].

## 2020-02-07 NOTE — Progress Notes (Signed)
44 y.o. G8P1011 Married White or Caucasian Not Hispanic or Latino female here for annual exam.   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 No cycle since having her son.  She doesn't recall ever being worked up for POF. She is sexually active, some vaginal dryness, some help with lubricant, still uncomfortable. Vasomotor symptoms are improved, rare and tolerable.     Strong FH of breast cancer, negative genetic testing, but so was her mother. Her lifetime risk of breast cancer is 25.7%  Patient's last menstrual period was 07/26/2011.          Sexually active: Yes.    The current method of family planning is none.    Exercising: No.  just walking her puppy Smoker:  no  Health Maintenance: Pap:  2016 normal  History of abnormal Pap:  Yes 20 years ago  MMG:  01/08/20 density C bi-rads 1 neg  BMD:   No  Colonoscopy: no  TDaP:  06/08/2012 Gardasil:  NA    reports that she quit smoking about 5 years ago. She has never used smokeless tobacco. She reports current alcohol use of about 6.0 standard drinks of alcohol per week. She reports that she does not use drugs. Has a 59 year old son and a puppy  Past Medical History:  Diagnosis Date  . Abnormal Pap smear   . Anemia   . Family history of breast cancer   . Former smoker   . Hormone disorder   . Infertility, female   . Newborn product of IVF pregnancy     Past Surgical History:  Procedure Laterality Date  . CESAREAN SECTION  06/07/2012   Procedure: CESAREAN SECTION;  Surgeon: Sharene Butters, MD;  Location: [facility];  Service: Gynecology;  Laterality: N/A;  . DILATION AND CURETTAGE OF UTERUS      Current Outpatient Medications  Medication Sig Dispense Refill  . acetaminophen (TYLENOL) 325 MG tablet Take 650 mg by mouth every 6 (six) hours as needed for moderate pain.      No current facility-administered medications for this visit.    Family History  Problem Relation Age of Onset  . Breast cancer Mother 49       second  cancer at 18  . Depression Mother   . Alcohol abuse Father   . Cancer Father        unknown  . Seizures Maternal Aunt   . Bipolar disorder Maternal Aunt   . Breast cancer Maternal Aunt 17       second dx 31  . Alcohol abuse Paternal Uncle   . Drug abuse Paternal Uncle   . Breast cancer Maternal Grandmother 21  . Alzheimer's disease Maternal Grandmother   . Dementia Maternal Grandmother   . Prostate cancer Maternal Grandfather   . Bipolar disorder Maternal Aunt   . Breast cancer Maternal Aunt 52  . Alcohol abuse Paternal Grandfather   . Throat cancer Maternal Uncle   . Breast cancer Other        MGMs mother  . Prostate cancer Other        MGFs brother  . Colon cancer Neg Hx     Review of Systems  Constitutional: Negative.   HENT: Negative.   Eyes: Negative.   Respiratory: Negative.   Cardiovascular: Negative.   Gastrointestinal: Negative.   Endocrine: Negative.   Genitourinary: Negative.   Musculoskeletal: Negative.   Allergic/Immunologic: Negative.   Neurological: Negative.   Hematological: Negative.  Psychiatric/Behavioral: Negative.     Exam:   BP 122/66   Pulse 88   Temp (!) 97.3 F (36.3 C)   Ht 5' 7.5" (1.715 m)   Wt 169 lb (76.7 kg)   LMP 07/26/2011   SpO2 98%   BMI 26.08 kg/m   Weight change: @WEIGHTCHANGE @ Height:   Height: 5' 7.5" (171.5 cm)  Ht Readings from Last 3 Encounters:  02/07/20 5' 7.5" (1.715 m)  12/21/19 5\' 8"  (1.727 m)  03/09/17 5\' 8"  (1.727 m)    General appearance: alert, cooperative and appears stated age Head: Normocephalic, without obvious abnormality, atraumatic Neck: no adenopathy, supple, symmetrical, trachea midline and thyroid normal to inspection and palpation Lungs: clear to auscultation bilaterally Cardiovascular: regular rate and rhythm Breasts: normal appearance, no masses or tenderness Abdomen: soft, non-tender; non distended,  no masses,  no organomegaly Extremities: extremities normal, atraumatic, no cyanosis or  edema Skin: Skin color, texture, turgor normal. No rashes or lesions Lymph nodes: Cervical, supraclavicular, and axillary nodes normal. No abnormal inguinal nodes palpated Neurologic: Grossly normal   Pelvic: External genitalia:  no lesions              Urethra:  normal appearing urethra with no masses, tenderness or lesions              Bartholins and Skenes: normal                 Vagina: mildly atrophic appearing vagina with normal color and discharge, no lesions              Cervix: no lesions               Bimanual Exam:  Uterus:  normal size, contour, position, consistency, mobility, non-tender              Adnexa: no mass, fullness, tenderness               Rectovaginal: Confirms               Anus:  normal sphincter tone, no lesions  chaperoned for the exam.  A:  Well Woman with normal exam  H/O POF, not on hormones  Elevated risk of breast cancer  Vit d def  Vaginal atrophy, discussed the option of vaginal estrogen and the possible low risk  P:   Pap with hpv  Screening labs with primary  DEXA ordered  FSH, estradiol, prolactin, TSH, antithyroid and antiadrenal antibiodies  Mammogram UTD  Breast MRI ordered  Discussed breast self exam  Discussed calcium and vit D intake  CC: 03/11/17

## 2020-02-08 ENCOUNTER — Other Ambulatory Visit: Payer: Self-pay | Admitting: *Deleted

## 2020-02-08 DIAGNOSIS — E559 Vitamin D deficiency, unspecified: Secondary | ICD-10-CM

## 2020-02-08 LAB — VITAMIN D 25 HYDROXY (VIT D DEFICIENCY, FRACTURES): Vit D, 25-Hydroxy: 15.6 ng/mL — ABNORMAL LOW (ref 30.0–100.0)

## 2020-02-08 LAB — PROLACTIN: Prolactin: 11.6 ng/mL (ref 4.8–23.3)

## 2020-02-08 LAB — ESTRADIOL: Estradiol: 6.9 pg/mL

## 2020-02-08 LAB — TSH: TSH: 0.804 u[IU]/mL (ref 0.450–4.500)

## 2020-02-08 LAB — THYROID PEROXIDASE ANTIBODY: Thyroperoxidase Ab SerPl-aCnc: <9 IU/mL (ref 0–34)

## 2020-02-08 LAB — FOLLICLE STIMULATING HORMONE: FSH: 116 m[IU]/mL

## 2020-02-08 MED ORDER — VITAMIN D (ERGOCALCIFEROL) 1.25 MG (50000 UNIT) PO CAPS: 50000.0000 [IU] | ORAL_CAPSULE | ORAL | 0 refills | Status: DC

## 2020-02-08 NOTE — Addendum Note (Signed)
Addended by: Leda Min on: 02/08/2020 12:36 PM   Modules accepted: Orders

## 2020-02-11 ENCOUNTER — Telehealth: Payer: Self-pay | Admitting: *Deleted

## 2020-02-11 DIAGNOSIS — Z9189 Other specified personal risk factors, not elsewhere classified: Secondary | ICD-10-CM

## 2020-02-11 DIAGNOSIS — Z803 Family history of malignant neoplasm of breast: Secondary | ICD-10-CM

## 2020-02-11 NOTE — Telephone Encounter (Signed)
-----   Message from Romualdo Bolk, MD sent at 02/07/2020  2:00 PM EDT ----- This patient needs a breast MRI, lifetime risk of breast cancer is 25%. Will someone please tell her the out of pocket expense. Thanks

## 2020-02-11 NOTE — Telephone Encounter (Signed)
Order placed for bilateral breast MRI w/wo contrast at [facility].  Dx: Increased lifetime risk, family hx breast cancer  Call to patient, advised as seen above. Advised patient our office will precert MRI, once scheduled. Advised patient she would need to contact her insurance company to review out of pocket cost. Questions answered. Advised patient [facility] will contact her directly to schedule. Patient verbalzies understanding.   Routing to provider for final review. Patient is agreeable to disposition. Will close encounter.  Cc: Soundra Pilon, [address] [place]

## 2020-02-12 ENCOUNTER — Other Ambulatory Visit (HOSPITAL_COMMUNITY)
Admission: RE | Admit: 2020-02-12 | Discharge: 2020-02-12 | Disposition: A | Discharge status: Home / Self Care | Payer: Managed Care, Other (non HMO) | Source: Ambulatory Visit | Attending: Obstetrics and Gynecology | Admitting: Obstetrics and Gynecology

## 2020-02-12 DIAGNOSIS — Z124 Encounter for screening for malignant neoplasm of cervix: Secondary | ICD-10-CM | POA: Insufficient documentation

## 2020-02-12 NOTE — Addendum Note (Signed)
Addended by: Tobi Bastos on: 02/12/2020 10:07 AM   Modules accepted: Orders

## 2020-02-13 LAB — CYTOLOGY - PAP
Comment: NEGATIVE
Diagnosis: NEGATIVE
High risk HPV: NEGATIVE

## 2020-02-16 LAB — 21-HYDROXYLASE ANTIBODIES: 21-Hydroxylase Antibodies: NEGATIVE

## 2020-03-07 ENCOUNTER — Other Ambulatory Visit: Payer: Self-pay

## 2020-03-07 ENCOUNTER — Encounter: Payer: Self-pay | Admitting: Physician Assistant

## 2020-03-07 ENCOUNTER — Telehealth: Payer: Self-pay | Admitting: Physician Assistant

## 2020-03-07 ENCOUNTER — Ambulatory Visit (INDEPENDENT_AMBULATORY_CARE_PROVIDER_SITE_OTHER): Payer: Managed Care, Other (non HMO) | Admitting: Physician Assistant

## 2020-03-07 ENCOUNTER — Ambulatory Visit
Admission: RE | Admit: 2020-03-07 | Discharge: 2020-03-07 | Disposition: A | Discharge status: Home / Self Care | Payer: Managed Care, Other (non HMO) | Source: Ambulatory Visit | Attending: Obstetrics and Gynecology | Admitting: Obstetrics and Gynecology

## 2020-03-07 VITALS — BP 122/68 | HR 83 | Temp 98.6°F | Ht 67.5 in | Wt 167.8 lb

## 2020-03-07 DIAGNOSIS — Z803 Family history of malignant neoplasm of breast: Secondary | ICD-10-CM

## 2020-03-07 DIAGNOSIS — R42 Dizziness and giddiness: Secondary | ICD-10-CM | POA: Diagnosis not present

## 2020-03-07 DIAGNOSIS — Z9189 Other specified personal risk factors, not elsewhere classified: Secondary | ICD-10-CM

## 2020-03-07 MED ORDER — GADOBUTROL 1 MMOL/ML IV SOLN
8.0000 mL | Freq: Once | INTRAVENOUS | Status: AC | PRN
  Administered 2020-03-07: 8 mL via INTRAVENOUS

## 2020-03-07 NOTE — Progress Notes (Signed)
Tamara [name] is a 44 y.o. female here for a new problem.  I acted as a Neurosurgeon for [employer], PA-C [employer], [place]  History of Present Illness:   Chief Complaint  Patient presents with  . Dizziness    HPI   Dizziness Has had 3 episodes of head feeling swimmy, perception is off, has "beer goggles" on but normal vision. No changes in vision. Happened the first time while she was driving on the interstate and has only occurred while she is driving.  Pt denies any headaches or pain. Has been trying to drink more water. No excessive caffeine. Feels like she could benefit from more sleep but doesn't feel sleep deprived.  Denies: weakness on one side of the body, slurred speech, double vision, pre-syncope or syncope  Mom has history of brain tumor.   BP Readings from Last 3 Encounters:  03/07/20 122/68  02/07/20 122/66  12/21/19 120/80     Past Medical History:  Diagnosis Date  . Abnormal Pap smear   . Anemia   . Family history of breast cancer   . Former smoker   . Hormone disorder   . Infertility, female   . Newborn product of IVF pregnancy      Social History   Socioeconomic History  . Marital status: Married    Spouse name: Not on file  . Number of children: 1  . Years of education: Not on file  . Highest education level: Not on file  Occupational History    Employer: [employer]  Tobacco Use  . Smoking status: Former Smoker    Quit date: 11/22/2014    Years since quitting: 5.2  . Smokeless tobacco: Never Used  Substance and Sexual Activity  . Alcohol use: Yes    Alcohol/week: 6.0 standard drinks    Types: 6 Glasses of wine per week  . Drug use: No  . Sexual activity: Yes  Other Topics Concern  . Not on file  Social History Narrative   Investment banker, corporate   Married   7 yo son   New puppy   Social Determinants of Health   Financial Resource Strain:   . Difficulty of Paying Living Expenses:   Food Insecurity:   . Worried About  Programme researcher, broadcasting/film/video in the Last Year:   . Barista in the Last Year:   Transportation Needs:   . Freight forwarder (Medical):   Marland Kitchen Lack of Transportation (Non-Medical):   Physical Activity:   . Days of Exercise per Week:   . Minutes of Exercise per Session:   Stress:   . Feeling of Stress :   Social Connections:   . Frequency of Communication with Friends and Family:   . Frequency of Social Gatherings with Friends and Family:   . Attends Religious Services:   . Active Member of Clubs or Organizations:   . Attends Banker Meetings:   Marland Kitchen Marital Status:   Intimate Partner Violence:   . Fear of Current or Ex-Partner:   . Emotionally Abused:   Marland Kitchen Physically Abused:   . Sexually Abused:     Past Surgical History:  Procedure Laterality Date  . CESAREAN SECTION  06/07/2012   Procedure: CESAREAN SECTION;  Surgeon: Mickel Baas, MD;  Location: [facility];  Service: Gynecology;  Laterality: N/A;  . DILATION AND CURETTAGE OF UTERUS      Family History  Problem Relation Age of Onset  . Breast cancer Mother 21  second cancer at 43  . Depression Mother   . Alcohol abuse Father   . Cancer Father        unknown  . Seizures Maternal Aunt   . Bipolar disorder Maternal Aunt   . Breast cancer Maternal Aunt 45       second dx 21  . Alcohol abuse Paternal Uncle   . Drug abuse Paternal Uncle   . Breast cancer Maternal Grandmother 56  . Alzheimer's disease Maternal Grandmother   . Dementia Maternal Grandmother   . Prostate cancer Maternal Grandfather   . Bipolar disorder Maternal Aunt   . Breast cancer Maternal Aunt 52  . Alcohol abuse Paternal Grandfather   . Throat cancer Maternal Uncle   . Breast cancer Other        MGMs mother  . Prostate cancer Other        MGFs brother  . Colon cancer Neg Hx     Allergies  Allergen Reactions  . Penicillins Nausea Only    Has patient had a PCN reaction causing immediate rash, facial/tongue/throat swelling, SOB or  lightheadedness with hypotension: no Has patient had a PCN reaction causing severe rash involving mucus membranes or skin necrosis: no Has patient had a PCN reaction that required hospitalization : no Has patient had a PCN reaction occurring within the last 10 years: no If all of the above answers are "NO", then may proceed with Cephalosporin use.     Current Medications:   Current Outpatient Medications:  .  acetaminophen (TYLENOL) 325 MG tablet, Take 650 mg by mouth every 6 (six) hours as needed for moderate pain. , Disp: , Rfl:  .  Vitamin D, Ergocalciferol, (DRISDOL) 1.25 MG (50000 UNIT) CAPS capsule, Take 1 capsule (50,000 Units total) by mouth every 7 (seven) days., Disp: 12 capsule, Rfl: 0   Review of Systems:   ROS  Negative unless otherwise specified per HPI.  Vitals:   Vitals:   03/07/20 1505  BP: 122/68  Pulse: 83  Temp: 98.6 F (37 C)  TempSrc: Temporal  SpO2: 95%  Weight: 167 lb 12.8 oz (76.1 kg)  Height: 5' 7.5" (1.715 m)     Body mass index is 25.89 kg/m.   Hearing Screening   125Hz  250Hz  500Hz  1000Hz  2000Hz  3000Hz  4000Hz  6000Hz  8000Hz   Right ear:           Left ear:             Visual Acuity Screening   Right eye Left eye Both eyes  Without correction: 20/30 20/30 20/20   With correction:        Physical Exam:   Physical Exam Vitals and nursing note reviewed.  Constitutional:      General: She is not in acute distress.    Appearance: She is well-developed. She is not ill-appearing or toxic-appearing.  Cardiovascular:     Rate and Rhythm: Normal rate and regular rhythm.     Pulses: Normal pulses.     Heart sounds: Normal heart sounds, S1 normal and S2 normal.     Comments: No LE edema Pulmonary:     Effort: Pulmonary effort is normal.     Breath sounds: Normal breath sounds.  Skin:    General: Skin is warm and dry.  Neurological:     General: No focal deficit present.     Mental Status: She is alert.     GCS: GCS eye subscore is 4. GCS  verbal subscore is 5. GCS motor subscore is  6.     Cranial Nerves: Cranial nerves are intact.     Sensory: Sensation is intact.     Motor: Motor function is intact.     Coordination: Coordination is intact. Romberg sign negative.     Gait: Gait is intact.  Psychiatric:        Speech: Speech normal.        Behavior: Behavior normal. Behavior is cooperative.      Assessment and Plan:   Pierce was seen today for dizziness.  Diagnoses and all orders for this visit:  Episodic lightheadedness   Unclear etiology of symptoms. Neuro exam is benign. Labs done recently relatively normal. No red flags on exam. Work on pushing fluids, regular meals, and adequate sleep. Recommend formal eye exam. If persists, will consider follow-up with specialist or consider imaging. Worsening precautions advised.  . Reviewed expectations re: course of current medical issues. . Discussed self-management of symptoms. . Outlined signs and symptoms indicating need for more acute intervention. . Patient verbalized understanding and all questions were answered. . See orders for this visit as documented in the electronic medical record. . Patient received an After-Visit Summary.  CMA or LPN served as scribe during this visit. History, Physical, and Plan performed by medical provider. The above documentation has been reviewed and is accurate and complete.  Inda Coke, PA-C

## 2020-03-07 NOTE — Telephone Encounter (Signed)
Chief Complaint Dizziness Reason for Call Symptomatic / Request for Health Information Initial Comment Caller states she has been having dizziness for a week that has worsened. Translation No Nurse Assessment Nurse: Yetta Barre, RN, Miranda Date/Time (Eastern Time): 03/06/2020 11:07:55 AM Confirm and document reason for call. If symptomatic, describe symptoms. ---Caller states about 1 week ago, she was driving and had an episode where she felt a strange feeling in her head. She was not able to continue to drive. She has continue to have this feeling off and on when she drives. Outside of these times, she has had a few episodes of occasional dizziness when sitting. Has the patient had close contact with a person known or suspected to have the novel coronavirus illness OR traveled / lives in area with major community spread (including international travel) in the last 14 days from the onset of symptoms? * If Asymptomatic, screen for exposure and travel within the last 14 days. ---No Does the patient have any new or worsening symptoms? ---Yes Will a triage be completed? ---Yes Related visit to physician within the last 2 weeks? ---No Does the PT have any chronic conditions? (i.e. diabetes, asthma, this includes High risk factors for pregnancy, etc.) ---No Is the patient pregnant or possibly pregnant? (Ask all females between the ages of 84-55) ---No Is this a behavioral health or substance abuse call? ---No Guidelines Guideline Title Affirmed Question Affirmed Notes Nurse Date/Time (Eastern Time) Dizziness - Vertigo [1] MODERATE dizziness (e.g., vertigo; feels very unsteady, interferes with normal Yetta Barre, RN, Miranda 03/06/2020 11:19:39 AMPLEASE NOTE: All timestamps contained within this report are represented as [place] Standard Time. CONFIDENTIALTY NOTICE: This fax transmission is intended only for the addressee. It contains information that is legally privileged, confidential or otherwise  protected from use or disclosure. If you are not the intended recipient, you are strictly prohibited from reviewing, disclosing, copying using or disseminating any of this information or taking any action in reliance on or regarding this information. If you have received this fax in error, please notify us immediately by telephone so that we can arrange for its return to [place]. Phone: [phone], Toll-Free: [phone], Fax: [phone] Page: 2 of 2 Call Id: [id number] Guidelines Guideline Title Affirmed Question Affirmed Notes Nurse Date/Time Lamount Cohen Time) activities) AND [2] has NOT been evaluated by physician for this Disp. Time Lamount Cohen Time) Disposition Final User 03/06/2020 11:26:57 AM See PCP within 24 Hours Yes Yetta Barre, RN, Lenetta Quaker Disagree/Comply Comply Caller Understands Yes PreDisposition Call Doctor Care Advice Given Per Guideline SEE PCP WITHIN 24 HOURS: * IF OFFICE WILL BE OPEN: You need to be seen within the next 24 hours. Call your doctor (or NP/PA) when the office opens and make an appointment. CALL BACK IF: * Severe headache occurs * You become worse

## 2020-03-07 NOTE — Patient Instructions (Signed)
It was great to see you!  Push fluids, try to get more sleep if you can.  All of your labs that were done last month were reassuring.  Please consider getting a formal eye exam to make sure there is nothing else going on.  Follow-up with me if no improvement.  Take care,  Jarold Motto PA-C

## 2020-03-18 ENCOUNTER — Other Ambulatory Visit: Payer: Managed Care, Other (non HMO)

## 2020-04-02 ENCOUNTER — Other Ambulatory Visit: Payer: Managed Care, Other (non HMO)

## 2020-04-02 ENCOUNTER — Other Ambulatory Visit: Payer: Self-pay | Admitting: Obstetrics and Gynecology

## 2020-04-02 DIAGNOSIS — E2839 Other primary ovarian failure: Secondary | ICD-10-CM

## 2020-05-19 ENCOUNTER — Other Ambulatory Visit: Payer: Self-pay

## 2020-05-19 ENCOUNTER — Other Ambulatory Visit (INDEPENDENT_AMBULATORY_CARE_PROVIDER_SITE_OTHER): Payer: Managed Care, Other (non HMO)

## 2020-05-19 DIAGNOSIS — E559 Vitamin D deficiency, unspecified: Secondary | ICD-10-CM

## 2020-05-20 LAB — VITAMIN D 25 HYDROXY (VIT D DEFICIENCY, FRACTURES): Vit D, 25-Hydroxy: 40.3 ng/mL (ref 30.0–100.0)

## 2020-06-12 ENCOUNTER — Other Ambulatory Visit: Payer: Managed Care, Other (non HMO)

## 2020-12-05 ENCOUNTER — Other Ambulatory Visit: Payer: Self-pay | Admitting: Physician Assistant

## 2020-12-05 DIAGNOSIS — Z1231 Encounter for screening mammogram for malignant neoplasm of breast: Secondary | ICD-10-CM

## 2020-12-24 ENCOUNTER — Ambulatory Visit (INDEPENDENT_AMBULATORY_CARE_PROVIDER_SITE_OTHER): Payer: Managed Care, Other (non HMO) | Admitting: Physician Assistant

## 2020-12-24 ENCOUNTER — Other Ambulatory Visit: Payer: Self-pay

## 2020-12-24 ENCOUNTER — Encounter: Payer: Self-pay | Admitting: Physician Assistant

## 2020-12-24 VITALS — BP 110/70 | HR 68 | Temp 97.9°F | Ht 67.5 in | Wt 167.5 lb

## 2020-12-24 DIAGNOSIS — Z23 Encounter for immunization: Secondary | ICD-10-CM

## 2020-12-24 DIAGNOSIS — Z0000 Encounter for general adult medical examination without abnormal findings: Secondary | ICD-10-CM

## 2020-12-24 DIAGNOSIS — E785 Hyperlipidemia, unspecified: Secondary | ICD-10-CM | POA: Diagnosis not present

## 2020-12-24 LAB — CBC WITH DIFFERENTIAL/PLATELET
Basophils Absolute: 0 10*3/uL (ref 0.0–0.1)
Basophils Relative: 0.8 % (ref 0.0–3.0)
Eosinophils Absolute: 0.1 10*3/uL (ref 0.0–0.7)
Eosinophils Relative: 1.3 % (ref 0.0–5.0)
HCT: 43.1 % (ref 36.0–46.0)
Hemoglobin: 14.4 g/dL (ref 12.0–15.0)
Lymphocytes Relative: 32.1 % (ref 12.0–46.0)
Lymphs Abs: 2 10*3/uL (ref 0.7–4.0)
MCHC: 33.3 g/dL (ref 30.0–36.0)
MCV: 91.3 fl (ref 78.0–100.0)
Monocytes Absolute: 0.4 10*3/uL (ref 0.1–1.0)
Monocytes Relative: 6.3 % (ref 3.0–12.0)
Neutro Abs: 3.8 10*3/uL (ref 1.4–7.7)
Neutrophils Relative %: 59.5 % (ref 43.0–77.0)
Platelets: 303 10*3/uL (ref 150.0–400.0)
RBC: 4.72 Mil/uL (ref 3.87–5.11)
RDW: 12.9 % (ref 11.5–15.5)
WBC: 6.3 10*3/uL (ref 4.0–10.5)

## 2020-12-24 LAB — LIPID PANEL
Cholesterol: 257 mg/dL — ABNORMAL HIGH (ref 0–200)
HDL: 56.8 mg/dL (ref >39.00)
LDL Cholesterol: 164 mg/dL — ABNORMAL HIGH (ref 0–99)
NonHDL: 199.71
Total CHOL/HDL Ratio: 5
Triglycerides: 178 mg/dL — ABNORMAL HIGH (ref 0.0–149.0)
VLDL: 35.6 mg/dL (ref 0.0–40.0)

## 2020-12-24 LAB — COMPREHENSIVE METABOLIC PANEL
ALT: 12 U/L (ref 0–35)
AST: 16 U/L (ref 0–37)
Albumin: 4.5 g/dL (ref 3.5–5.2)
Alkaline Phosphatase: 65 U/L (ref 39–117)
BUN: 13 mg/dL (ref 6–23)
CO2: 31 mEq/L (ref 19–32)
Calcium: 9.5 mg/dL (ref 8.4–10.5)
Chloride: 102 mEq/L (ref 96–112)
Creatinine, Ser: 0.77 mg/dL (ref 0.40–1.20)
GFR: 92.46 mL/min (ref >60.00)
Glucose, Bld: 92 mg/dL (ref 70–99)
Potassium: 4.2 mEq/L (ref 3.5–5.1)
Sodium: 137 mEq/L (ref 135–145)
Total Bilirubin: 0.9 mg/dL (ref 0.2–1.2)
Total Protein: 7.1 g/dL (ref 6.0–8.3)

## 2020-12-24 NOTE — Patient Instructions (Signed)
It was great to see you!  Please go to the lab for blood work.   Our office will call you with your results unless you have chosen to receive results via MyChart.  If your blood work is normal we will follow-up each year for physicals and as scheduled for chronic medical problems.  If anything is abnormal we will treat accordingly and get you in for a follow-up.  Take care,  Tamara [name]    Health Maintenance, Female Adopting a healthy lifestyle and getting preventive care are important in promoting health and wellness. Ask your health care provider about:  The right schedule for you to have regular tests and exams.  Things you can do on your own to prevent diseases and keep yourself healthy. What should I know about diet, weight, and exercise? Eat a healthy diet  Eat a diet that includes plenty of vegetables, fruits, low-fat dairy products, and lean protein.  Do not eat a lot of foods that are high in solid fats, added sugars, or sodium.   Maintain a healthy weight Body mass index (BMI) is used to identify weight problems. It estimates body fat based on height and weight. Your health care provider can help determine your BMI and help you achieve or maintain a healthy weight. Get regular exercise Get regular exercise. This is one of the most important things you can do for your health. Most adults should:  Exercise for at least 150 minutes each week. The exercise should increase your heart rate and make you sweat (moderate-intensity exercise).  Do strengthening exercises at least twice a week. This is in addition to the moderate-intensity exercise.  Spend less time sitting. Even light physical activity can be beneficial. Watch cholesterol and blood lipids Have your blood tested for lipids and cholesterol at 47 years of age, then have this test every 5 years. Have your cholesterol levels checked more often if:  Your lipid or cholesterol levels are high.  You are older than 47  years of age.  You are at high risk for heart disease. What should I know about cancer screening? Depending on your health history and family history, you may need to have cancer screening at various ages. This may include screening for:  Breast cancer.  Cervical cancer.  Colorectal cancer.  Skin cancer.  Lung cancer. What should I know about heart disease, diabetes, and high blood pressure? Blood pressure and heart disease  High blood pressure causes heart disease and increases the risk of stroke. This is more likely to develop in people who have high blood pressure readings, are of African descent, or are overweight.  Have your blood pressure checked: ? Every 3-5 years if you are 18-39 years of age. ? Every year if you are 40 years old or older. Diabetes Have regular diabetes screenings. This checks your fasting blood sugar level. Have the screening done:  Once every three years after age 40 if you are at a normal weight and have a low risk for diabetes.  More often and at a younger age if you are overweight or have a high risk for diabetes. What should I know about preventing infection? Hepatitis B If you have a higher risk for hepatitis B, you should be screened for this virus. Talk with your health care provider to find out if you are at risk for hepatitis B infection. Hepatitis C Testing is recommended for:  Everyone born from 1945 through 1965.  Anyone with known risk factors for hepatitis C.   Sexually transmitted infections (STIs)  Get screened for STIs, including gonorrhea and chlamydia, if: ? You are sexually active and are younger than 47 years of age. ? You are older than 47 years of age and your health care provider tells you that you are at risk for this type of infection. ? Your sexual activity has changed since you were last screened, and you are at increased risk for chlamydia or gonorrhea. Ask your health care provider if you are at risk.  Ask your health  care provider about whether you are at high risk for HIV. Your health care provider may recommend a prescription medicine to help prevent HIV infection. If you choose to take medicine to prevent HIV, you should first get tested for HIV. You should then be tested every 3 months for as long as you are taking the medicine. Pregnancy  If you are about to stop having your period (premenopausal) and you may become pregnant, seek counseling before you get pregnant.  Take 400 to 800 micrograms (mcg) of folic acid every day if you become pregnant.  Ask for birth control (contraception) if you want to prevent pregnancy. Osteoporosis and menopause Osteoporosis is a disease in which the bones lose minerals and strength with aging. This can result in bone fractures. If you are 65 years old or older, or if you are at risk for osteoporosis and fractures, ask your health care provider if you should:  Be screened for bone loss.  Take a calcium or vitamin D supplement to lower your risk of fractures.  Be given hormone replacement therapy (HRT) to treat symptoms of menopause. Follow these instructions at home: Lifestyle  Do not use any products that contain nicotine or tobacco, such as cigarettes, e-cigarettes, and chewing tobacco. If you need help quitting, ask your health care provider.  Do not use street drugs.  Do not share needles.  Ask your health care provider for help if you need support or information about quitting drugs. Alcohol use  Do not drink alcohol if: ? Your health care provider tells you not to drink. ? You are pregnant, may be pregnant, or are planning to become pregnant.  If you drink alcohol: ? Limit how much you use to 0-1 drink a day. ? Limit intake if you are breastfeeding.  Be aware of how much alcohol is in your drink. In the [place] one drink equals one 12 oz bottle of beer (355 mL), one 5 oz glass of wine (148 mL), or one 1 oz glass of hard liquor (44 mL). General  instructions  Schedule regular health, dental, and eye exams.  Stay current with your vaccines.  Tell your health care provider if: ? You often feel depressed. ? You have ever been abused or do not feel safe at home. Summary  Adopting a healthy lifestyle and getting preventive care are important in promoting health and wellness.  Follow your health care provider's instructions about healthy diet, exercising, and getting tested or screened for diseases.  Follow your health care provider's instructions on monitoring your cholesterol and blood pressure. This information is not intended to replace advice given to you by your health care provider. Make sure you discuss any questions you have with your health care provider. Document Revised: 11/01/2018 Document Reviewed: 11/01/2018 Elsevier Patient Education  2021 [employer] Inc.  

## 2020-12-24 NOTE — Progress Notes (Signed)
I acted as a Neurosurgeon for [employer], PA-C Corky Mull, LPN    Subjective:    Tamara [name] is a 45 y.o. female and is here for a comprehensive physical exam.   HPI  Health Maintenance Due  Topic Date Due  . Hepatitis C Screening  Never done  . COLONOSCOPY (Pts 45-63yrs Insurance coverage will need to be confirmed)  Never done  . INFLUENZA VACCINE  06/22/2020  . COVID-19 Vaccine (3 - Booster for Pfizer series) 12/01/2020    Acute Concerns: None  Chronic Issues: HLD -- currently not on medication, due for lipid panel.  Health Maintenance: Immunizations -- UTD, will give Flu today Colonoscopy -- due Mammogram -- UTD, scheduled 01/19/21 PAP -- UTD, due 01/2025 Bone Density -- N/A Diet -- decent intake; drinking a little less sodas daily (1 daily) Sleep habits -- no concern Exercise -- none Weight -- Weight: 167 lb 8 oz (76 kg)  Mood -- no concern Weight history: Wt Readings from Last 10 Encounters:  12/24/20 167 lb 8 oz (76 kg)  03/07/20 167 lb 12.8 oz (76.1 kg)  02/07/20 169 lb (76.7 kg)  12/21/19 172 lb (78 kg)  03/09/17 163 lb 12.8 oz (74.3 kg)  06/07/12 190 lb (86.2 kg)   Body mass index is 25.85 kg/m. Patient's last menstrual period was 07/26/2011. Alcohol use: about 1 glass per night Tobacco use: former  Depression screen PHQ 2/9 12/24/2020  Decreased Interest 0  Down, Depressed, Hopeless 0  PHQ - 2 Score 0   Behind with dentist UTD with eye doctor  Other providers/specialists: Patient Care Team: Jarold Motto, [place] as PCP - General (Physician Assistant)    PMHx, SurgHx, SocialHx, Medications, and Allergies were reviewed in the Visit Navigator and updated as appropriate.   Past Medical History:  Diagnosis Date  . Abnormal Pap smear   . Anemia   . Family history of breast cancer   . Former smoker   . Hormone disorder   . Infertility, female   . Newborn product of IVF pregnancy      Past Surgical History:  Procedure  Laterality Date  . CESAREAN SECTION  06/07/2012   Procedure: CESAREAN SECTION;  Surgeon: Mickel Baas, MD;  Location: [facility];  Service: Gynecology;  Laterality: N/A;  . DILATION AND CURETTAGE OF UTERUS       Family History  Problem Relation Age of Onset  . Breast cancer Mother 14       second cancer at 49  . Depression Mother   . Alcohol abuse Father   . Cancer Father        unknown  . Seizures Maternal Aunt   . Bipolar disorder Maternal Aunt   . Breast cancer Maternal Aunt 45       second dx 27  . Alcohol abuse Paternal Uncle   . Drug abuse Paternal Uncle   . Breast cancer Maternal Grandmother 44  . Alzheimer's disease Maternal Grandmother   . Dementia Maternal Grandmother   . Prostate cancer Maternal Grandfather   . Bipolar disorder Maternal Aunt   . Breast cancer Maternal Aunt 52  . Alcohol abuse Paternal Grandfather   . Throat cancer Maternal Uncle   . Breast cancer Other        MGMs mother  . Prostate cancer Other        MGFs brother  . Colon cancer Neg Hx     Social History   Tobacco Use  . Smoking status: Former  Smoker    Quit date: 11/22/2014    Years since quitting: 6.0  . Smokeless tobacco: Never Used  Substance Use Topics  . Alcohol use: Yes    Alcohol/week: 6.0 standard drinks    Types: 6 Glasses of wine per week  . Drug use: No    Review of Systems:   Review of Systems  Constitutional: Negative for chills, fever, malaise/fatigue and weight loss.  HENT: Negative for hearing loss, sinus pain and sore throat.   Eyes: Negative for blurred vision.  Respiratory: Negative for cough and shortness of breath.   Cardiovascular: Negative for chest pain, palpitations and leg swelling.  Gastrointestinal: Negative for abdominal pain, constipation, diarrhea, heartburn, nausea and vomiting.  Genitourinary: Negative for dysuria, frequency and urgency.  Musculoskeletal: Negative for back pain, myalgias and neck pain.  Skin: Negative for itching and rash.   Neurological: Negative for dizziness, tingling, seizures, loss of consciousness and headaches.  Endo/Heme/Allergies: Negative for polydipsia.  Psychiatric/Behavioral: Negative for depression. The patient is not nervous/anxious.   All other systems reviewed and are negative.   Objective:   BP 110/70 (BP Location: Left Arm, Patient Position: Sitting, Cuff Size: Normal)   Pulse 68   Temp 97.9 F (36.6 C) (Temporal)   Ht 5' 7.5" (1.715 m)   Wt 167 lb 8 oz (76 kg)   LMP 07/26/2011   SpO2 97%   BMI 25.85 kg/m  Body mass index is 25.85 kg/m.   General Appearance:    Alert, cooperative, no distress, appears stated age  Head:    Normocephalic, without obvious abnormality, atraumatic  Eyes:    PERRL, conjunctiva/corneas clear, EOM's intact, fundi    benign, both eyes  Ears:    Normal TM's and external ear canals, both ears  Nose:   Nares normal, septum midline, mucosa normal, no drainage    or sinus tenderness  Throat:   Lips, mucosa, and tongue normal; teeth and gums normal  Neck:   Supple, symmetrical, trachea midline, no adenopathy;    thyroid:  no enlargement/tenderness/nodules; no carotid   bruit or JVD  Back:     Symmetric, no curvature, ROM normal, no CVA tenderness  Lungs:     Clear to auscultation bilaterally, respirations unlabored  Chest Wall:    No tenderness or deformity   Heart:    Regular rate and rhythm, S1 and S2 normal, no murmur, rub or gallop  Breast Exam:    Deferred  Abdomen:     Soft, non-tender, bowel sounds active all four quadrants,    no masses, no organomegaly  Genitalia:    Deferred  Extremities:   Extremities normal, atraumatic, no cyanosis or edema  Pulses:   2+ and symmetric all extremities  Skin:   Skin color, texture, turgor normal, no rashes or lesions  Lymph nodes:   Cervical, supraclavicular, and axillary nodes normal  Neurologic:   CNII-XII intact, normal strength, sensation and reflexes    throughout     Assessment/Plan:   Olita was  seen today for annual exam.  Diagnoses and all orders for this visit:  Routine physical examination Today patient counseled on age appropriate routine health concerns for screening and prevention, each reviewed and up to date or declined. Immunizations reviewed and up to date or declined. Labs ordered and reviewed. Risk factors for depression reviewed and negative. Hearing function and visual acuity are intact. ADLs screened and addressed as needed. Functional ability and level of safety reviewed and appropriate. Education, counseling and referrals performed  based on assessed risks today. Patient provided with a copy of personalized plan for preventive services.  Hyperlipidemia, unspecified hyperlipidemia type Update lipid panel today and provide recommendations accordingly. -     CBC with Differential/Platelet -     Comprehensive metabolic panel -     Lipid panel   Well Adult Exam: Labs ordered: Yes. Patient counseling was done. See below for items discussed. Discussed the patient's BMI.  The BMI is in the acceptable range Follow up in one year. Breast cancer screening: UTD. Cervical cancer screening: UTD   Patient Counseling: [x]    Nutrition: Stressed importance of moderation in sodium/caffeine intake, saturated fat and cholesterol, caloric balance, sufficient intake of fresh fruits, vegetables, fiber, calcium, iron, and 1 mg of folate supplement per day (for females capable of pregnancy).  [x]    Stressed the importance of regular exercise.   [x]    Substance Abuse: Discussed cessation/primary prevention of tobacco, alcohol, or other drug use; driving or other dangerous activities under the influence; availability of treatment for abuse.   [x]    Injury prevention: Discussed safety belts, safety helmets, smoke detector, smoking near bedding or upholstery.   [x]    Sexuality: Discussed sexually transmitted diseases, partner selection, use of condoms, avoidance of unintended pregnancy  and  contraceptive alternatives.  [x]    Dental health: Discussed importance of regular tooth brushing, flossing, and dental visits.  [x]    Health maintenance and immunizations reviewed. Please refer to Health maintenance section.   CMA or LPN served as scribe during this visit. History, Physical, and Plan performed by medical provider. The above documentation has been reviewed and is accurate and complete.  , PA-C [facility]

## 2021-01-19 ENCOUNTER — Ambulatory Visit
Admission: RE | Admit: 2021-01-19 | Discharge: 2021-01-19 | Disposition: A | Discharge status: Home / Self Care | Payer: Managed Care, Other (non HMO) | Source: Ambulatory Visit | Attending: Physician Assistant | Admitting: Physician Assistant

## 2021-01-19 ENCOUNTER — Other Ambulatory Visit: Payer: Self-pay

## 2021-01-19 DIAGNOSIS — Z1231 Encounter for screening mammogram for malignant neoplasm of breast: Secondary | ICD-10-CM

## 2021-02-13 ENCOUNTER — Ambulatory Visit: Payer: Managed Care, Other (non HMO) | Admitting: Obstetrics and Gynecology

## 2021-03-26 NOTE — Progress Notes (Deleted)
45 y.o. G28P1011 Married White or Caucasian Not Hispanic or Latino female here for annual exam.      Patient's last menstrual period was 07/26/2011.          Sexually active: Yes. The current method of family planning is none. Exercising: yes walking Smoker: No.  Health Maintenance: Pap: 01/23/20 Neg History of abnormal Pap: Yes. 20 yrs ago MMG:  2.28.22 3D/Neg/ Density C/BiRads 1 BMD:  No. Colonoscopy: No. TDaP: 06/08/12 Gardasil: n/a   reports that she quit smoking about 6 years ago. She has never used smokeless tobacco. She reports current alcohol use of about 6.0 standard drinks of alcohol per week. She reports that she does not use drugs.  Past Medical History:  Diagnosis Date  . Abnormal Pap smear   . Anemia   . Family history of breast cancer   . Former smoker   . Hormone disorder   . Infertility, female   . Newborn product of IVF pregnancy     Past Surgical History:  Procedure Laterality Date  . CESAREAN SECTION  06/07/2012   Procedure: CESAREAN SECTION;  Surgeon: Mickel Baas, MD;  Location: [facility];  Service: Gynecology;  Laterality: N/A;  . DILATION AND CURETTAGE OF UTERUS      Current Outpatient Medications  Medication Sig Dispense Refill  . acetaminophen (TYLENOL) 325 MG tablet Take 650 mg by mouth every 6 (six) hours as needed for moderate pain.     . Vitamin D, Cholecalciferol, 25 MCG (1000 UT) CAPS Take 1 capsule by mouth daily in the afternoon.     No current facility-administered medications for this visit.    Family History  Problem Relation Age of Onset  . Breast cancer Mother 9       second cancer at 97  . Depression Mother   . Alcohol abuse Father   . Cancer Father        unknown  . Seizures Maternal Aunt   . Bipolar disorder Maternal Aunt   . Breast cancer Maternal Aunt 45       second dx 79  . Alcohol abuse Paternal Uncle   . Drug abuse Paternal Uncle   . Breast cancer Maternal Grandmother 16  . Alzheimer's disease Maternal  Grandmother   . Dementia Maternal Grandmother   . Prostate cancer Maternal Grandfather   . Bipolar disorder Maternal Aunt   . Breast cancer Maternal Aunt 52  . Alcohol abuse Paternal Grandfather   . Throat cancer Maternal Uncle   . Breast cancer Other        MGMs mother  . Prostate cancer Other        MGFs brother  . Colon cancer Neg Hx     Review of Systems  Exam:   LMP 07/26/2011   Weight change: @WEIGHTCHANGE @ Height:      Ht Readings from Last 3 Encounters:  12/24/20 5' 7.5" (1.715 m)  03/07/20 5' 7.5" (1.715 m)  02/07/20 5' 7.5" (1.715 m)    General appearance: alert, cooperative and appears stated age Head: Normocephalic, without obvious abnormality, atraumatic Neck: no adenopathy, supple, symmetrical, trachea midline and thyroid {CHL AMB PHY EX THYROID NORM [phone]::"normal to inspection and palpation"} Lungs: clear to auscultation bilaterally Cardiovascular: regular rate and rhythm Breasts: {Exam; breast:[id number]::"normal appearance, no masses or tenderness"} Abdomen: soft, non-tender; non distended,  no masses,  no organomegaly Extremities: extremities normal, atraumatic, no cyanosis or edema Skin: Skin color, texture, turgor normal. No rashes or lesions Lymph nodes: Cervical, supraclavicular, and  axillary nodes normal. No abnormal inguinal nodes palpated Neurologic: Grossly normal   Pelvic: External genitalia:  no lesions              Urethra:  normal appearing urethra with no masses, tenderness or lesions              Bartholins and Skenes: normal                 Vagina: normal appearing vagina with normal color and discharge, no lesions              Cervix: {CHL AMB PHY EX CERVIX NORM [phone]::"no lesions"}               Bimanual Exam:  Uterus:  {CHL AMB PHY EX UTERUS NORM DEFAULT:[phone]::"normal size, contour, position, consistency, mobility, non-tender"}              Adnexa: {CHL AMB PHY EX ADNEXA NO MASS DEFAULT:[phone]::"no mass,  fullness, tenderness"}               Rectovaginal: Confirms               Anus:  normal sphincter tone, no lesions  *** chaperoned for the exam.  A:  Well Woman with normal exam  P:

## 2021-04-07 ENCOUNTER — Ambulatory Visit: Payer: Self-pay | Admitting: Obstetrics and Gynecology

## 2021-06-15 NOTE — Progress Notes (Signed)
45 y.o. G32P1011 Married White or Caucasian Not Hispanic or Latino female here for annual exam.  She is experiencing loss of libido, it has worsened in the last 2 years. Painful to have sex, vagina hurts, entry discomfort.    H/O POF. No cycle since having her son 9 years ago, he was conceived with donor egg.   No vaginal bleeding, no bowel or bladder c/o.   Strong FH of breast cancer, negative genetic testing, but so was her mother. Her lifetime risk of breast cancer is 25.7%  Patient's last menstrual period was 07/26/2011.          Sexually active: Yes.    The current method of family planning is post menopausal status.    Exercising: Yes.     Patient plays basketball and runs with her child  Smoker:  no  Health Maintenance: Pap:  02/12/20 Negative, negative hpv; 2016 Normal  History of abnormal Pap:  yes 20 years ago, cryosurgery in her early 20's  MMG:  01/21/21 density C Bi-rads 1 neg  03/07/20: negative Breast MRI BMD:   none  Colonoscopy: none TDaP:  06/08/2012 Gardasil: NA   reports that she quit smoking about 6 years ago. Her smoking use included cigarettes. She has never used smokeless tobacco. She reports current alcohol use of about 6.0 standard drinks of alcohol per week. She reports that she does not use drugs. 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   Past Medical History:  Diagnosis Date   Abnormal Pap smear    Anemia    Family history of breast cancer    Former smoker    Hormone disorder    Infertility, female    Newborn product of IVF pregnancy     Past Surgical History:  Procedure Laterality Date   CESAREAN SECTION  06/07/2012   Procedure: CESAREAN SECTION;  Surgeon: Mickel Baas, MD;  Location: [facility];  Service: Gynecology;  Laterality: N/A;   DILATION AND CURETTAGE OF UTERUS      Current Outpatient Medications  Medication Sig Dispense Refill   acetaminophen (TYLENOL) 325 MG tablet Take 650 mg by mouth every 6 (six) hours as needed for moderate  pain.      No current facility-administered medications for this visit.    Family History  Problem Relation Age of Onset   Breast cancer Mother 69       second cancer at 59   Depression Mother    Alcohol abuse Father    Cancer Father        unknown   Seizures Maternal Aunt    Bipolar disorder Maternal Aunt    Breast cancer Maternal Aunt 7       second dx 107   Alcohol abuse Paternal Uncle    Drug abuse Paternal Uncle    Breast cancer Maternal Grandmother 67   Alzheimer's disease Maternal Grandmother    Dementia Maternal Grandmother    Prostate cancer Maternal Grandfather    Bipolar disorder Maternal Aunt    Breast cancer Maternal Aunt 52   Alcohol abuse Paternal Grandfather    Throat cancer Maternal Uncle    Breast cancer Other        MGMs mother   Prostate cancer Other        MGFs brother   Colon cancer Neg Hx     Review of Systems  All other systems reviewed and are negative.  Exam:   BP 110/60   Pulse 68   Ht 5'  9" (1.753 m)   Wt 165 lb 12.8 oz (75.2 kg)   LMP 07/26/2011   SpO2 100%   BMI 24.48 kg/m   Weight change: @WEIGHTCHANGE @ Height:   Height: 5\' 9"  (175.3 cm)  Ht Readings from Last 3 Encounters:  06/16/21 5\' 9"  (1.753 m)  12/24/20 5' 7.5" (1.715 m)  03/07/20 5' 7.5" (1.715 m)    General appearance: alert, cooperative and appears stated age Head: Normocephalic, without obvious abnormality, atraumatic Neck: no adenopathy, supple, symmetrical, trachea midline and thyroid normal to inspection and palpation Lungs: clear to auscultation bilaterally Cardiovascular: regular rate and rhythm Breasts: normal appearance, no masses or tenderness Abdomen: soft, non-tender; non distended,  no masses,  no organomegaly Extremities: extremities normal, atraumatic, no cyanosis or edema Skin: Skin color, texture, turgor normal. No rashes or lesions Lymph nodes: Cervical, supraclavicular, and axillary nodes normal. No abnormal inguinal nodes palpated Neurologic:  Grossly normal   Pelvic: External genitalia:  no lesions              Urethra:  normal appearing urethra with no masses, tenderness or lesions              Bartholins and Skenes: normal                 Vagina: mildly atrophic appearing vagina with normal color and discharge, no lesions              Cervix: no lesions               Bimanual Exam:  Uterus:  normal size, contour, position, consistency, mobility, non-tender              Adnexa: no mass, fullness, tenderness               Rectovaginal: Confirms               Anus:  normal sphincter tone, no lesions  chaperoned for the exam.  1. Well woman exam Discussed breast self exam Discussed calcium and vit D intake No pap this year Mammogram UTD  2. Family history of breast cancer   3. Premature ovarian failure   4. At high risk for breast cancer Discussed yearly MRI's, discussed residual contrast  Will hold on MRI this year. Consider MRI in the fall of 2023  5. Vitamin D deficiency She will restart her supplements  Will get her level checked with her next blood work  6. Vaginal atrophy - Estradiol 10 MCG TABS vaginal tablet; Place one tablet in the vagina qhs x 1 week, then change to 2 x a week at hs  Dispense: 24 tablet; Refill: 3  7. Colon cancer screening Reviewed options - Ambulatory referral to Gastroenterology  8. Dyspareunia in female Start vaginal estrogen  Use lubrication  9. Low libido Information given Consider testosterone levels Discussed off label use of testosterone.

## 2021-06-16 ENCOUNTER — Ambulatory Visit (INDEPENDENT_AMBULATORY_CARE_PROVIDER_SITE_OTHER): Payer: Self-pay | Admitting: Obstetrics and Gynecology

## 2021-06-16 ENCOUNTER — Encounter: Payer: Self-pay | Admitting: Obstetrics and Gynecology

## 2021-06-16 ENCOUNTER — Other Ambulatory Visit: Payer: Self-pay

## 2021-06-16 VITALS — BP 110/60 | HR 68 | Ht 69.0 in | Wt 165.8 lb

## 2021-06-16 DIAGNOSIS — N941 Unspecified dyspareunia: Secondary | ICD-10-CM

## 2021-06-16 DIAGNOSIS — E559 Vitamin D deficiency, unspecified: Secondary | ICD-10-CM

## 2021-06-16 DIAGNOSIS — N952 Postmenopausal atrophic vaginitis: Secondary | ICD-10-CM

## 2021-06-16 DIAGNOSIS — R6882 Decreased libido: Secondary | ICD-10-CM

## 2021-06-16 DIAGNOSIS — Z1211 Encounter for screening for malignant neoplasm of colon: Secondary | ICD-10-CM

## 2021-06-16 DIAGNOSIS — Z803 Family history of malignant neoplasm of breast: Secondary | ICD-10-CM

## 2021-06-16 DIAGNOSIS — E2839 Other primary ovarian failure: Secondary | ICD-10-CM

## 2021-06-16 DIAGNOSIS — Z01419 Encounter for gynecological examination (general) (routine) without abnormal findings: Secondary | ICD-10-CM

## 2021-06-16 DIAGNOSIS — Z9189 Other specified personal risk factors, not elsewhere classified: Secondary | ICD-10-CM

## 2021-06-16 MED ORDER — ESTRADIOL 10 MCG VA TABS: ORAL_TABLET | VAGINAL | 3 refills | Status: DC

## 2021-07-25 DIAGNOSIS — R309 Painful micturition, unspecified: Secondary | ICD-10-CM | POA: Diagnosis not present

## 2021-07-25 DIAGNOSIS — R8289 Other abnormal findings on cytological and histological examination of urine: Secondary | ICD-10-CM | POA: Diagnosis not present

## 2021-07-25 DIAGNOSIS — N390 Urinary tract infection, site not specified: Secondary | ICD-10-CM | POA: Diagnosis not present

## 2021-10-20 ENCOUNTER — Encounter: Payer: Self-pay | Admitting: Internal Medicine

## 2021-12-14 ENCOUNTER — Telehealth: Payer: Self-pay | Admitting: Physician Assistant

## 2021-12-14 ENCOUNTER — Other Ambulatory Visit: Payer: Self-pay

## 2021-12-14 ENCOUNTER — Ambulatory Visit: Payer: BC Managed Care – PPO | Admitting: Physician Assistant

## 2021-12-14 ENCOUNTER — Encounter: Payer: Self-pay | Admitting: Physician Assistant

## 2021-12-14 VITALS — BP 110/72 | HR 88 | Temp 97.5°F | Ht 69.0 in | Wt 163.2 lb

## 2021-12-14 DIAGNOSIS — R239 Unspecified skin changes: Secondary | ICD-10-CM

## 2021-12-14 NOTE — Progress Notes (Addendum)
Tamara [name] is a 46 y.o. female here for a enlarged vein.  History of Present Illness:   Chief Complaint  Patient presents with   Enlarged vein    Pt c/o enlarged vein out left thigh are popped up yesterday. Denies pain. Pt has been having some left knee issues.    HPI  Enlarged Vein  Tamara [name] presents with concerns due to enlarged vein on the back of her left upper thigh. States that she initially noticed this last night, but due to left knee pain that had been onset for 2-3 days and fear of it being a varicose vein or other vascular issue, she decided to have this evaluated. Denies recent injury, swelling, tenderness, or increase in activity.  Past Medical History:  Diagnosis Date   Abnormal Pap smear    Anemia    Family history of breast cancer    Former smoker    Hormone disorder    Infertility, female    Newborn product of IVF pregnancy      Social History   Tobacco Use   Smoking status: Former    Types: Cigarettes    Quit date: 11/22/2014    Years since quitting: 7.0   Smokeless tobacco: Never  Substance Use Topics   Alcohol use: Yes    Alcohol/week: 6.0 standard drinks    Types: 6 Glasses of wine per week   Drug use: No    Past Surgical History:  Procedure Laterality Date   CESAREAN SECTION  06/07/2012   Procedure: CESAREAN SECTION;  Surgeon: Sharene Butters, MD;  Location: [facility];  Service: Gynecology;  Laterality: N/A;   DILATION AND CURETTAGE OF UTERUS      Family History  Problem Relation Age of Onset   Breast cancer Mother 66       second cancer at 44   Depression Mother    Alcohol abuse Father    Cancer Father        unknown   Seizures Maternal Aunt    Bipolar disorder Maternal Aunt    Breast cancer Maternal Aunt 78       second dx 19   Alcohol abuse Paternal Uncle    Drug abuse Paternal Uncle    Breast cancer Maternal Grandmother 60   Alzheimer's disease Maternal Grandmother    Dementia Maternal Grandmother    Prostate cancer  Maternal Grandfather    Bipolar disorder Maternal Aunt    Breast cancer Maternal Aunt 52   Alcohol abuse Paternal Grandfather    Throat cancer Maternal Uncle    Breast cancer Other        MGMs mother   Prostate cancer Other        MGFs brother   Colon cancer Neg Hx     Allergies  Allergen Reactions   Penicillins Nausea Only    Has patient had a PCN reaction causing immediate rash, facial/tongue/throat swelling, SOB or lightheadedness with hypotension: no Has patient had a PCN reaction causing severe rash involving mucus membranes or skin necrosis: no Has patient had a PCN reaction that required hospitalization : no Has patient had a PCN reaction occurring within the last 10 years: no If all of the above answers are "NO", then may proceed with Cephalosporin use.     Current Medications:   Current Outpatient Medications:    acetaminophen (TYLENOL) 325 MG tablet, Take 650 mg by mouth every 6 (six) hours as needed for moderate pain. , Disp: , Rfl:    Review  of Systems:   ROS Negative unless otherwise specified per HPI.  Vitals:   Vitals:   12/14/21 1400  BP: 110/72  Pulse: 88  Temp: (!) 97.5 F (36.4 C)  TempSrc: Temporal  SpO2: 95%  Weight: 163 lb 4 oz (74 kg)  Height: 5\' 9"  (1.753 m)     Body mass index is 24.11 kg/m.  Physical Exam:   Physical Exam Constitutional:      Appearance: Normal appearance. She is well-developed.  HENT:     Head: Normocephalic and atraumatic.  Eyes:     General: Lids are normal.     Extraocular Movements: Extraocular movements intact.     Conjunctiva/sclera: Conjunctivae normal.  Pulmonary:     Effort: Pulmonary effort is normal.  Musculoskeletal:        General: Normal range of motion.     Cervical back: Normal range of motion and neck supple.     Comments: Normal ROM of BLE No calf tenderness/swelling b/l  Skin:    General: Skin is warm and dry.     Comments: Approximately 5-inch circular area of mottled erythematous skin  to lateral left posterior thigh without TTP No swelling to legs bilaterally  Neurological:     Mental Status: She is alert and oriented to person, place, and time.  Psychiatric:        Attention and Perception: Attention and perception normal.        Mood and Affect: Mood normal.        Behavior: Behavior normal.        Thought Content: Thought content normal.        Judgment: Judgment normal.    Assessment and Plan:   Skin change No red flags on exam Patient briefly evaluated by Dr Jerline Pain Unclear etiology -- possible small hamstring trauma  Does not appear infectious or concern for clot Recommended patient to use ACE bandage for compression if comfortable Follow up in 1 week, sooner if concerns  Consider blood work for vasculitis, autoimmmune or other pathology if persists; may also send to sports medicine if any further knee pain  I,Havlyn C Ratchford,acting as a scribe for [employer], PA.,have documented all relevant documentation on the behalf of Tamara Coke, PA,as directed by  Tamara Coke, PA while in the presence of Tamara [name], [place].  I, Tamara [name], [place], have reviewed all documentation for this visit. The documentation on 12/14/21 for the exam, diagnosis, procedures, and orders are all accurate and complete.   Tamara Coke, PA-C

## 2021-12-14 NOTE — Telephone Encounter (Signed)
Pt scheduled for today at 2:00 PM. See Triage note

## 2021-12-14 NOTE — Telephone Encounter (Signed)
Pt has an appt with Bufford Buttner at 2pm on 12/14/21  Patient Name: Tamara [name] ALL Gender: Female DOB: 1976/05/08 Age: 46 Y 5 M 19 D Return Phone Number: [phone] (Primary) Address: City/ State/ Zip: [place]  [address] Client [facility] - Administrator, sports at [facility] Contact Type Call Who Is Calling Patient / Member / Family / Caregiver Call Type Triage / Clinical Relationship To Patient Self Return Phone Number [phone] (Primary) Chief Complaint Leg Pain Reason for Call Symptomatic / Request for Health Information Initial Comment Caller states she has a vein in the back of her thigh that is enlarged. She states her thigh feels tight. Translation No Nurse Assessment Nurse: Tresa Endo, RN, Kim Date/Time (Eastern Time): 12/14/2021 8:45:23 AM Confirm and document reason for call. If symptomatic, describe symptoms. ---Caller states she has a vein in the back of her left upper thigh that is enlarged. States she noticed it last night, not painful, no swelling. States she has also had pain in the left knee for the past 2 to 3 days, currently not painful but is slightly swollen. Does the patient have any new or worsening symptoms? ---Yes Will a triage be completed? ---Yes Related visit to physician within the last 2 weeks? ---No Does the PT have any chronic conditions? (i.e. diabetes, asthma, this includes High risk factors for pregnancy, etc.) ---No Is the patient pregnant or possibly pregnant? (Ask all females between the ages of 42-55) ---No Is this a behavioral health or substance abuse call? ---No Guidelines Guideline Title Affirmed Question Affirmed Notes Nurse Date/Time Lamount Cohen Time) Leg Swelling and Edema [1] Red area or streak [2] large (> 2 in. or 5 cm) Tresa Endo, RN, Kim 12/14/2021 8:48:11 AM Disp. Time Lamount Cohen Time) Disposition Final User 12/14/2021 8:49:53 AM See HCP within 4 Hours (or PCP  triage) Yes Tresa Endo, RN, Tami Lin Disagree/Comply Comply Caller Understands Yes PreDisposition Call Doctor Care Advice Given Per Guideline SEE HCP (OR PCP TRIAGE) WITHIN 4 HOURS: * IF OFFICE WILL BE OPEN: You need to be seen within the next 3 or 4 hours. Call your doctor (or NP/PA) now or as soon as the office opens. CALL BACK IF: * You become worse CARE ADVICE given per Leg Swelling and Edema (Adult) guideline. Referrals [facility] - ED Warm transfer to backline

## 2021-12-24 ENCOUNTER — Telehealth: Payer: Self-pay

## 2021-12-24 NOTE — Telephone Encounter (Signed)
Please see message and advise 

## 2021-12-24 NOTE — Telephone Encounter (Signed)
Spoke to pt asked her if there were any changes to the veins or any new symptoms or are they the same? Pt said no changes, it is the same. Told her Lelon Mast is out of the office the rest of the day I will get back to you tomorrow with what she wants to do next. Pt verbalized understanding.

## 2021-12-24 NOTE — Telephone Encounter (Signed)
Duplicate

## 2021-12-24 NOTE — Telephone Encounter (Signed)
Patient is calling in stating she was told at her last OV with Sam, that if the veins in her legs did not get better to give her a call.  Patient states they are not any better and would like to know what the next step is?

## 2021-12-24 NOTE — Telephone Encounter (Signed)
Left message on voicemail to call office.  

## 2021-12-24 NOTE — Telephone Encounter (Signed)
Patient called back to speak to [facility].

## 2021-12-24 NOTE — Telephone Encounter (Signed)
Tamara [name] pt said there are no changes to the veins and no new symptoms. Please advise.

## 2021-12-25 ENCOUNTER — Other Ambulatory Visit: Payer: Self-pay

## 2021-12-25 ENCOUNTER — Ambulatory Visit (AMBULATORY_SURGERY_CENTER): Payer: BC Managed Care – PPO | Admitting: *Deleted

## 2021-12-25 ENCOUNTER — Other Ambulatory Visit: Payer: Self-pay | Admitting: Physician Assistant

## 2021-12-25 VITALS — Ht 68.0 in | Wt 162.0 lb

## 2021-12-25 DIAGNOSIS — R239 Unspecified skin changes: Secondary | ICD-10-CM

## 2021-12-25 DIAGNOSIS — Z1211 Encounter for screening for malignant neoplasm of colon: Secondary | ICD-10-CM

## 2021-12-25 NOTE — Telephone Encounter (Signed)
Spoke to pt told her Aldona Bar has put in follow-up blood work and urinary studies for this.  She would like for you to schedule a lab appointment at her earliest convenience to have this done. Pt verbalized understanding and asked if anything on Monday. Appt scheduled for Monday at 9:45 AM. Pt verbalized understanding.

## 2021-12-25 NOTE — Progress Notes (Signed)
No egg or soy allergy known to patient  No issues known to pt with past sedation with any surgeries or procedures Patient denies ever being told they had issues or difficulty with intubation  No FH of Malignant Hyperthermia Pt is not on diet pills Pt is not on  home 02  Pt is not on blood thinners  Pt denies issues with constipation  No A fib or A flutter  Pt is fully vaccinated  for Covid   Due to the COVID-19 pandemic we are asking patients to follow certain guidelines in PV and the [facility]   Pt aware of COVID protocols and [facility] guidelines   PV completed over the phone. Pt verified name, DOB, address and insurance during PV today.  Pt emailed instruction packet to [email or web address]  Pt encouraged to call with questions or issues.  If pt has My chart, procedure instructions sent via My Chart

## 2021-12-28 ENCOUNTER — Other Ambulatory Visit (INDEPENDENT_AMBULATORY_CARE_PROVIDER_SITE_OTHER): Payer: BC Managed Care – PPO

## 2021-12-28 ENCOUNTER — Other Ambulatory Visit: Payer: Self-pay

## 2021-12-28 DIAGNOSIS — R239 Unspecified skin changes: Secondary | ICD-10-CM

## 2021-12-28 LAB — CBC WITH DIFFERENTIAL/PLATELET
Basophils Absolute: 0 10*3/uL (ref 0.0–0.1)
Basophils Relative: 1 % (ref 0.0–3.0)
Eosinophils Absolute: 0 10*3/uL (ref 0.0–0.7)
Eosinophils Relative: 1 % (ref 0.0–5.0)
HCT: 44.1 % (ref 36.0–46.0)
Hemoglobin: 14.6 g/dL (ref 12.0–15.0)
Lymphocytes Relative: 27 % (ref 12.0–46.0)
Lymphs Abs: 1.3 10*3/uL (ref 0.7–4.0)
MCHC: 33.1 g/dL (ref 30.0–36.0)
MCV: 92.8 fl (ref 78.0–100.0)
Monocytes Absolute: 0.3 10*3/uL (ref 0.1–1.0)
Monocytes Relative: 5.9 % (ref 3.0–12.0)
Neutro Abs: 3.1 10*3/uL (ref 1.4–7.7)
Neutrophils Relative %: 65.1 % (ref 43.0–77.0)
Platelets: 296 10*3/uL (ref 150.0–400.0)
RBC: 4.75 Mil/uL (ref 3.87–5.11)
RDW: 12.4 % (ref 11.5–15.5)
WBC: 4.8 10*3/uL (ref 4.0–10.5)

## 2021-12-28 LAB — URINALYSIS, ROUTINE W REFLEX MICROSCOPIC
Bilirubin Urine: NEGATIVE
Hgb urine dipstick: NEGATIVE
Ketones, ur: NEGATIVE
Leukocytes,Ua: NEGATIVE
Nitrite: NEGATIVE
RBC / HPF: NONE SEEN (ref 0–?)
Specific Gravity, Urine: 1.015 (ref 1.000–1.030)
Total Protein, Urine: NEGATIVE
Urine Glucose: NEGATIVE
Urobilinogen, UA: 0.2 (ref 0.0–1.0)
pH: 7.5 (ref 5.0–8.0)

## 2021-12-28 LAB — COMPREHENSIVE METABOLIC PANEL
ALT: 11 U/L (ref 0–35)
AST: 15 U/L (ref 0–37)
Albumin: 4.3 g/dL (ref 3.5–5.2)
Alkaline Phosphatase: 57 U/L (ref 39–117)
BUN: 11 mg/dL (ref 6–23)
CO2: 31 mEq/L (ref 19–32)
Calcium: 9 mg/dL (ref 8.4–10.5)
Chloride: 102 mEq/L (ref 96–112)
Creatinine, Ser: 0.82 mg/dL (ref 0.40–1.20)
GFR: 85.13 mL/min (ref >60.00)
Glucose, Bld: 84 mg/dL (ref 70–99)
Potassium: 3.9 mEq/L (ref 3.5–5.1)
Sodium: 139 mEq/L (ref 135–145)
Total Bilirubin: 1.2 mg/dL (ref 0.2–1.2)
Total Protein: 6.7 g/dL (ref 6.0–8.3)

## 2021-12-28 LAB — SEDIMENTATION RATE: Sed Rate: 10 mm/hr (ref 0–20)

## 2021-12-28 LAB — C-REACTIVE PROTEIN: CRP: <1 mg/dL (ref 0.5–20.0)

## 2021-12-29 LAB — ANCA TITERS
Atypical pANCA: <1:20 {titer}
C-ANCA: <1:20 {titer}
P-ANCA: <1:20 {titer}

## 2021-12-31 ENCOUNTER — Telehealth: Payer: Self-pay | Admitting: *Deleted

## 2021-12-31 NOTE — Telephone Encounter (Signed)
Pt calling regarding lab results. Told her Tamara [name] has not reviewed them yet and will be out of the office till Monday. As soon as she does I will be in touch. Pt verbalized understanding.

## 2022-01-01 LAB — HEPATITIS PANEL, ACUTE
Hep A IgM: NONREACTIVE
Hep B C IgM: NONREACTIVE
Hepatitis B Surface Ag: NONREACTIVE
Hepatitis C Ab: NONREACTIVE
SIGNAL TO CUT-OFF: 0.02 (ref <1.00)

## 2022-01-01 LAB — C3 AND C4
C3 Complement: 133 mg/dL (ref 83–193)
C4 Complement: 29 mg/dL (ref 15–57)

## 2022-01-01 LAB — ANA: Anti Nuclear Antibody (ANA): NEGATIVE

## 2022-01-06 ENCOUNTER — Telehealth: Payer: Self-pay | Admitting: Internal Medicine

## 2022-01-06 ENCOUNTER — Telehealth: Payer: Self-pay | Admitting: Physician Assistant

## 2022-01-06 DIAGNOSIS — R239 Unspecified skin changes: Secondary | ICD-10-CM

## 2022-01-06 NOTE — Telephone Encounter (Signed)
Patient called wanting lab results from 12/28/21.Please call patient at [phone].

## 2022-01-06 NOTE — Telephone Encounter (Signed)
Spoke to pt told her Samantha sent her a My Chart message about your labs. Pt said she did not see it. Told pt per Aldona Bar, all of your blood work has returned and is normal. Pt verbalized understanding.  If your skin is still abnormal, please let me know and I will refer you urgently to a dermatologist. Pt said it is still bothering her. Told her okay will place an Urgent referral and someone will contact you to schedule an appt. Pt verbalized understanding.

## 2022-01-06 NOTE — Telephone Encounter (Signed)
Okay thanks no charge

## 2022-01-06 NOTE — Telephone Encounter (Signed)
Good Morning Dr. Leone Payor,    Patient called to cancel colonoscopy on 2/17 due to a scheduling conflict.   Patient stated that she will call back at a later time to be rescheduled.

## 2022-01-08 ENCOUNTER — Encounter: Payer: Managed Care, Other (non HMO) | Admitting: Internal Medicine

## 2022-01-12 ENCOUNTER — Telehealth: Payer: Self-pay | Admitting: Dermatology

## 2022-01-12 NOTE — Telephone Encounter (Signed)
Patient is calling for a referral appointment from [place], [place], but does not want to wait until September 2023 for an appointment so patient would like referral sent back to [facility] office.

## 2022-01-12 NOTE — Telephone Encounter (Signed)
Notes documented and referral routed back to referring office. 

## 2022-01-15 ENCOUNTER — Other Ambulatory Visit: Payer: Self-pay | Admitting: Physician Assistant

## 2022-01-15 ENCOUNTER — Other Ambulatory Visit: Payer: Self-pay

## 2022-01-15 ENCOUNTER — Telehealth: Payer: Self-pay | Admitting: Physician Assistant

## 2022-01-15 DIAGNOSIS — R239 Unspecified skin changes: Secondary | ICD-10-CM

## 2022-01-15 DIAGNOSIS — Z1231 Encounter for screening mammogram for malignant neoplasm of breast: Secondary | ICD-10-CM

## 2022-01-15 NOTE — Telephone Encounter (Signed)
Pt states that the Dermatology office that she has been referred to does not have any new pt appts for 6 month. Pt wants to be referred to a differnet office. Please call the patient at [phone].

## 2022-01-15 NOTE — Telephone Encounter (Signed)
Spoke w/ pt and verified DOB; pt agreed to trying [facility] to see if she can get in sooner. Placed new referral for pt and pt verbalized understanding

## 2022-01-20 ENCOUNTER — Ambulatory Visit: Payer: BC Managed Care – PPO

## 2022-01-21 ENCOUNTER — Ambulatory Visit
Admission: RE | Admit: 2022-01-21 | Discharge: 2022-01-21 | Disposition: A | Discharge status: Home / Self Care | Payer: BC Managed Care – PPO | Source: Ambulatory Visit | Attending: Physician Assistant | Admitting: Physician Assistant

## 2022-01-21 ENCOUNTER — Other Ambulatory Visit: Payer: Self-pay

## 2022-01-21 DIAGNOSIS — Z1231 Encounter for screening mammogram for malignant neoplasm of breast: Secondary | ICD-10-CM

## 2022-04-05 ENCOUNTER — Ambulatory Visit: Payer: BC Managed Care – PPO | Admitting: Dermatology

## 2022-08-16 ENCOUNTER — Encounter: Payer: Self-pay | Admitting: *Deleted

## 2022-11-04 ENCOUNTER — Encounter: Payer: Self-pay | Admitting: *Deleted

## 2023-01-30 ENCOUNTER — Other Ambulatory Visit: Payer: Self-pay

## 2023-01-30 ENCOUNTER — Emergency Department (HOSPITAL_BASED_OUTPATIENT_CLINIC_OR_DEPARTMENT_OTHER)
Admission: EM | Admit: 2023-01-30 | Discharge: 2023-01-30 | Disposition: A | Discharge status: Home / Self Care | Payer: BC Managed Care – PPO | Attending: Emergency Medicine | Admitting: Emergency Medicine

## 2023-01-30 ENCOUNTER — Encounter (HOSPITAL_BASED_OUTPATIENT_CLINIC_OR_DEPARTMENT_OTHER): Payer: Self-pay | Admitting: Emergency Medicine

## 2023-01-30 DIAGNOSIS — S6991XA Unspecified injury of right wrist, hand and finger(s), initial encounter: Secondary | ICD-10-CM | POA: Diagnosis not present

## 2023-01-30 DIAGNOSIS — W268XXA Contact with other sharp object(s), not elsewhere classified, initial encounter: Secondary | ICD-10-CM | POA: Diagnosis not present

## 2023-01-30 DIAGNOSIS — S61011A Laceration without foreign body of right thumb without damage to nail, initial encounter: Secondary | ICD-10-CM | POA: Insufficient documentation

## 2023-01-30 DIAGNOSIS — Z23 Encounter for immunization: Secondary | ICD-10-CM | POA: Insufficient documentation

## 2023-01-30 MED ORDER — LIDOCAINE HCL 2 % IJ SOLN
10.0000 mL | Freq: Once | INTRAMUSCULAR | Status: AC
Start: 2023-01-30 — End: 2023-01-30
  Administered 2023-01-30: 200 mg via INTRADERMAL
  Filled 2023-01-30: qty 20

## 2023-01-30 MED ORDER — CEPHALEXIN 500 MG PO CAPS: 500.0000 mg | ORAL_CAPSULE | Freq: Four times a day (QID) | ORAL | 0 refills | Status: DC

## 2023-01-30 MED ORDER — IBUPROFEN 600 MG PO TABS: 600.0000 mg | ORAL_TABLET | Freq: Four times a day (QID) | ORAL | 0 refills | Status: AC | PRN

## 2023-01-30 MED ORDER — TETANUS-DIPHTH-ACELL PERTUSSIS 5-2.5-18.5 LF-MCG/0.5 IM SUSY
0.5000 mL | PREFILLED_SYRINGE | Freq: Once | INTRAMUSCULAR | Status: AC
  Administered 2023-01-30: 0.5 mL via INTRAMUSCULAR
  Filled 2023-01-30: qty 0.5

## 2023-01-30 NOTE — ED Provider Notes (Signed)
[facility] Provider Note   CSN: [id number] Arrival date & time: 01/30/23  1250     History  No chief complaint on file.   Tamara [name] is a 47 y.o. female.  The history is provided by the patient and medical records. No language interpreter was used.     47 year old female presenting for evaluation of thumb injury.  Patient accidentally sliced her right thumb on a mandolin today.  She reported acute onset of sharp pain to the affected area.  She tries to apply a dressing but it keeps bleeding prompting this ER visit.  She is right-hand dominant.  She is unsure last tetanus status.  She denies any other injury.  No numbness.  Home Medications Prior to Admission medications   Medication Sig Start Date End Date Taking? Authorizing Provider  acetaminophen (TYLENOL) 325 MG tablet Take 650 mg by mouth every 6 (six) hours as needed for moderate pain.     [provider]      Allergies    Penicillins    Review of Systems   Review of Systems  All other systems reviewed and are negative.   Physical Exam Updated Vital Signs BP 124/73 (BP Location: Left Arm)   Pulse 78   Temp 98.5 F (36.9 C)   Resp 14   LMP 07/26/2011   SpO2 99%  Physical Exam Vitals and nursing note reviewed.  Constitutional:      General: She is not in acute distress.    Appearance: She is well-developed.  HENT:     Head: Atraumatic.  Eyes:     Conjunctiva/sclera: Conjunctivae normal.  Pulmonary:     Effort: Pulmonary effort is normal.  Musculoskeletal:        General: Signs of injury (Right thumb: Complete thickness laceration noted to the medial aspect of the thumb approximately 3cm in diameter  without joint involvement or nail involvement.) present.     Cervical back: Neck supple.  Skin:    Findings: No rash.  Neurological:     Mental Status: She is alert.  Psychiatric:        Mood and Affect: Mood normal.     ED Results / Procedures  / Treatments   Labs (all labs ordered are listed, but only abnormal results are displayed) Labs Reviewed - No data to display  EKG None  Radiology No results found.  Procedures .Marland KitchenLaceration Repair  Date/Time: 01/30/2023 2:00 PM  Performed by: Domenic Moras, PA-C Authorized by: Domenic Moras, PA-C   Consent:    Consent obtained:  Verbal   Consent given by:  Patient   Risks discussed:  Infection, pain and poor wound healing   Alternatives discussed:  Delayed treatment Anesthesia:    Anesthesia method:  Nerve block   Block location:  Base of thumb   Block needle gauge:  25 G   Block anesthetic:  Lidocaine 2% w/o epi   Block technique:  Digital nerve block   Block injection procedure:  Anatomic landmarks identified, introduced needle, incremental injection, anatomic landmarks palpated and negative aspiration for blood   Block outcome:  Anesthesia achieved Laceration details:    Location:  Finger   Finger location:  R thumb   Length (cm):  3   Depth (mm):  2 Pre-procedure details:    Preparation:  Patient was prepped and draped in usual sterile fashion Exploration:    Limited defect created (wound extended): no     Hemostasis achieved with:  Cautery   Imaging outcome: foreign body not noted     Wound extent: no foreign body, no signs of injury, no underlying fracture and no vascular damage     Contaminated: no   Treatment:    Area cleansed with:  Povidone-iodine   Amount of cleaning:  Standard   Irrigation solution:  Sterile saline   Irrigation method:  Pressure wash   Visualized foreign bodies/material removed: no     Debridement:  None Skin repair:    Repair method:  Tissue adhesive Approximation:    Approximation:  Loose Repair type:    Repair type:  Intermediate Post-procedure details:    Dressing:  Non-adherent dressing   Procedure completion:  Tolerated well, no immediate complications     Medications Ordered in ED Medications  lidocaine (XYLOCAINE) 2 %  (with pres) injection 200 mg (200 mg Intradermal Given by Other 01/30/23 1403)  Tdap (BOOSTRIX) injection 0.5 mL (0.5 mLs Intramuscular Given 01/30/23 1340)    ED Course/ Medical Decision Making/ A&P                             Medical Decision Making Risk Prescription drug management.   BP 124/73 (BP Location: Left Arm)   Pulse 78   Temp 98.5 F (36.9 C)   Resp 14   LMP 07/26/2011   SpO2 99%   30:90 PM 47 year old female presenting for evaluation of thumb injury.  Patient accidentally sliced her right thumb on a mandolin today.  She reported acute onset of sharp pain to the affected area.  She tries to apply a dressing but it keeps bleeding prompting this ER visit.  She is right-hand dominant.  She is unsure last tetanus status.  She denies any other injury.  No numbness.  On exam, right thumb today is a complete skin avulsion noted to the medial aspect of the thumb approximately 2 cm in diameter not involving the nail or the joint.  No foreign body noted.  It is actively bleeding.  2:01 PM I performed digital nerve block to achieve anesthesia and decreased discomfort for patient's right thumb laceration.  A tourniquet was placed, I achieved hemostasis using Bovie cauterize and apply Xeroform gauze as well as Coban to the wound.  Patient tolerates well.  Instruct patient to keep the dressing on for at least 2 to 3 days, monitor for any signs of infection, and I will also prescribe antibiotic use only in the event that she noticed any infection.  Anti-inflammatory medication prescribed as well.        Final Clinical Impression(s) / ED Diagnoses Final diagnoses:  Laceration of right thumb without foreign body without damage to nail, initial encounter    Rx / DC Orders ED Discharge Orders          Ordered    cephALEXin (KEFLEX) 500 MG capsule  4 times daily        01/30/23 1417    ibuprofen (ADVIL) 600 MG tablet  Every 6 hours PRN        01/30/23 1417               Domenic Moras, PA-C 01/30/23 1418    Gareth Morgan, MD 01/31/23 0004

## 2023-01-30 NOTE — ED Notes (Signed)
Discharge paperwork given and verbally understood. 

## 2023-01-30 NOTE — ED Triage Notes (Signed)
Right thumb sliced on mandolin today. No blood thinners. Unsure of last tetanus .

## 2023-02-04 ENCOUNTER — Ambulatory Visit: Payer: BC Managed Care – PPO | Admitting: Physician Assistant

## 2023-02-04 ENCOUNTER — Encounter: Payer: Self-pay | Admitting: Physician Assistant

## 2023-02-04 VITALS — BP 100/64 | HR 69 | Temp 98.0°F | Ht 68.0 in | Wt 172.0 lb

## 2023-02-04 DIAGNOSIS — S61011D Laceration without foreign body of right thumb without damage to nail, subsequent encounter: Secondary | ICD-10-CM | POA: Diagnosis not present

## 2023-02-04 NOTE — Progress Notes (Signed)
Tamara [name] is a 47 y.o. female here for a follow up of a pre-existing problem.  History of Present Illness:   Chief Complaint  Patient presents with   Follow-up    Pt was seen in the ED on 3/10 for laceration of right thumb    HPI  R thumb laceration Sustained mandolin injury on 01/30/23 and went to ER due to profuse bleeding She received a digital block and chemical cautery Xeroform dressing applied and she was instructed to keep this on for 2-3 days She was prescribed keflex for this in case infection symptoms arose and she did not require this  She comes in today because the guaze has adhered to her skin She is wanting to take this off and reassess  Denies: increased pain, bleeding, fever, chills   Past Medical History:  Diagnosis Date   Abnormal Pap smear    Anemia    Family history of breast cancer    Former smoker    Hormone disorder    Hyperlipidemia    no meds   Infertility, female    Newborn product of IVF pregnancy      Social History   Tobacco Use   Smoking status: Former    Types: Cigarettes    Quit date: 11/22/2014    Years since quitting: 8.2   Smokeless tobacco: Never  Substance Use Topics   Alcohol use: Yes    Alcohol/week: 6.0 standard drinks of alcohol    Types: 6 Glasses of wine per week   Drug use: No    Past Surgical History:  Procedure Laterality Date   CESAREAN SECTION  06/07/2012   Procedure: CESAREAN SECTION;  Surgeon: Sharene Butters, MD;  Location: [facility];  Service: Gynecology;  Laterality: N/A;   DILATION AND CURETTAGE OF UTERUS     TOOTH EXTRACTION     age 37-10    Family History  Problem Relation Age of Onset   Breast cancer Mother 47       second cancer at 46   Depression Mother    Alcohol abuse Father    Cancer Father        unknown   Seizures Maternal Aunt    Bipolar disorder Maternal Aunt    Breast cancer Maternal Aunt 45       second dx 62   Bipolar disorder Maternal Aunt    Breast cancer Maternal Aunt 52    Throat cancer Maternal Uncle    Alcohol abuse Paternal Uncle    Drug abuse Paternal Uncle    Breast cancer Maternal Grandmother 75   Alzheimer's disease Maternal Grandmother    Dementia Maternal Grandmother    Prostate cancer Maternal Grandfather    Alcohol abuse Paternal Grandfather    Breast cancer Other        MGMs mother   Prostate cancer Other        MGFs brother   Colon cancer Neg Hx    Colon polyps Neg Hx    Rectal cancer Neg Hx    Stomach cancer Neg Hx     Allergies  Allergen Reactions   Penicillins Nausea Only    Has patient had a PCN reaction causing immediate rash, facial/tongue/throat swelling, SOB or lightheadedness with hypotension: no Has patient had a PCN reaction causing severe rash involving mucus membranes or skin necrosis: no Has patient had a PCN reaction that required hospitalization : no Has patient had a PCN reaction occurring within the last 10 years: no If  all of the above answers are "NO", then may proceed with Cephalosporin use.     Current Medications:   Current Outpatient Medications:    acetaminophen (TYLENOL) 325 MG tablet, Take 650 mg by mouth every 6 (six) hours as needed for moderate pain. , Disp: , Rfl:    ibuprofen (ADVIL) 600 MG tablet, Take 1 tablet (600 mg total) by mouth every 6 (six) hours as needed., Disp: 30 tablet, Rfl: 0   cephALEXin (KEFLEX) 500 MG capsule, Take 1 capsule (500 mg total) by mouth 4 (four) times daily. (Patient not taking: Reported on 02/04/2023), Disp: 20 capsule, Rfl: 0   Review of Systems:   ROS Negative unless otherwise specified per HPI.  Vitals:   Vitals:   02/04/23 1107  BP: 100/64  Pulse: 69  Temp: 98 F (36.7 C)  TempSrc: Temporal  SpO2: 99%  Weight: 172 lb (78 kg)  Height: 5\' 8"  (1.727 m)     Body mass index is 26.15 kg/m.  Physical Exam:   Physical Exam Constitutional:      Appearance: Normal appearance. She is well-developed.  HENT:     Head: Normocephalic and atraumatic.  Eyes:      General: Lids are normal.     Extraocular Movements: Extraocular movements intact.     Conjunctiva/sclera: Conjunctivae normal.  Pulmonary:     Effort: Pulmonary effort is normal.  Musculoskeletal:        General: Normal range of motion.     Cervical back: Normal range of motion and neck supple.  Skin:    General: Skin is warm and dry.     Comments: R thumb pad with slightly blood-soaked xeroform gauze adhered to wound  Neurological:     Mental Status: She is alert and oriented to person, place, and time.  Psychiatric:        Attention and Perception: Attention and perception normal.        Mood and Affect: Mood normal.        Behavior: Behavior normal.        Thought Content: Thought content normal.        Judgment: Judgment normal.    Procedure: Wound care Consent: Risks and benefits of therapy discussed with patient who voices understanding and agrees with planned care. No barriers to communication or understanding identified.  After obtaining informed consent, the patient's identity, procedure, and site were verified during a pause prior to proceeding with the minor surgical procedure as per universal protocol recommendations. Pt aware of risks not limited to but including infection, bleeding, damage to near by organs.  Meds, vitals, and allergies reviewed.   R thumb was soaked in saline Gentle force was used to remove xeroform dressing We then applied saline via squirt bottle with pressure while removing the rest of the dressing  Scant bleeding occurred after removal of xeroform Area was soaked again in saline Bacitracin applied and non-stick bandage applied, followed by coban   Assessment and Plan:   Thumb laceration, right, subsequent encounter Tolerated procedure overall well Aftercare, including wound care, risks of bleeding and infection were discussed. All questions answered.  Return for wound check as needed for further evaluation and management.   Time spent  with patient today 02/04/23 was 32 minutes which consisted of chart review, wound care and discussion options with supervising physician, discussing diagnosis, work up, treatment answering questions and documentation.   Inda Coke, PA-C

## 2023-02-07 ENCOUNTER — Telehealth: Payer: Self-pay | Admitting: *Deleted

## 2023-02-07 NOTE — Telephone Encounter (Signed)
Left message on voicemail calling to see how your finger is doing? Please call the office.

## 2023-02-10 NOTE — Telephone Encounter (Signed)
Please see message about pt's thumb.

## 2023-02-10 NOTE — Telephone Encounter (Signed)
Spoke to pt asked her how her thumb is doing? Pt said it is doing better, scabbed up and healing. Told her okay I will let Samantha know.

## 2023-08-08 ENCOUNTER — Other Ambulatory Visit: Payer: Self-pay | Admitting: Physician Assistant

## 2023-08-08 DIAGNOSIS — Z0000 Encounter for general adult medical examination without abnormal findings: Secondary | ICD-10-CM

## 2023-08-23 ENCOUNTER — Ambulatory Visit
Admission: RE | Admit: 2023-08-23 | Discharge: 2023-08-23 | Disposition: A | Discharge status: Home / Self Care | Payer: BC Managed Care – PPO | Source: Ambulatory Visit | Attending: Physician Assistant | Admitting: Physician Assistant

## 2023-08-23 DIAGNOSIS — Z0000 Encounter for general adult medical examination without abnormal findings: Secondary | ICD-10-CM

## 2023-08-23 DIAGNOSIS — Z1231 Encounter for screening mammogram for malignant neoplasm of breast: Secondary | ICD-10-CM | POA: Diagnosis not present

## 2023-08-25 DIAGNOSIS — H938X9 Other specified disorders of ear, unspecified ear: Secondary | ICD-10-CM | POA: Diagnosis not present

## 2023-08-25 DIAGNOSIS — R0981 Nasal congestion: Secondary | ICD-10-CM | POA: Diagnosis not present

## 2024-03-30 ENCOUNTER — Ambulatory Visit: Admitting: Physician Assistant

## 2024-04-25 ENCOUNTER — Ambulatory Visit (INDEPENDENT_AMBULATORY_CARE_PROVIDER_SITE_OTHER): Admitting: Physician Assistant

## 2024-04-25 ENCOUNTER — Encounter: Payer: Self-pay | Admitting: Physician Assistant

## 2024-04-25 VITALS — BP 110/60 | HR 67 | Temp 97.9°F | Ht 68.0 in | Wt 169.5 lb

## 2024-04-25 DIAGNOSIS — Z0000 Encounter for general adult medical examination without abnormal findings: Secondary | ICD-10-CM

## 2024-04-25 DIAGNOSIS — E785 Hyperlipidemia, unspecified: Secondary | ICD-10-CM

## 2024-04-25 DIAGNOSIS — Z1211 Encounter for screening for malignant neoplasm of colon: Secondary | ICD-10-CM

## 2024-04-25 DIAGNOSIS — Z720 Tobacco use: Secondary | ICD-10-CM

## 2024-04-25 DIAGNOSIS — R202 Paresthesia of skin: Secondary | ICD-10-CM | POA: Diagnosis not present

## 2024-04-25 LAB — CBC WITH DIFFERENTIAL/PLATELET
Basophils Absolute: 0.1 10*3/uL (ref 0.0–0.1)
Basophils Relative: 1.2 % (ref 0.0–3.0)
Eosinophils Absolute: 0.1 10*3/uL (ref 0.0–0.7)
Eosinophils Relative: 2.1 % (ref 0.0–5.0)
HCT: 45 % (ref 36.0–46.0)
Hemoglobin: 15 g/dL (ref 12.0–15.0)
Lymphocytes Relative: 35.4 % (ref 12.0–46.0)
Lymphs Abs: 1.9 10*3/uL (ref 0.7–4.0)
MCHC: 33.3 g/dL (ref 30.0–36.0)
MCV: 93.3 fl (ref 78.0–100.0)
Monocytes Absolute: 0.3 10*3/uL (ref 0.1–1.0)
Monocytes Relative: 6 % (ref 3.0–12.0)
Neutro Abs: 2.9 10*3/uL (ref 1.4–7.7)
Neutrophils Relative %: 55.3 % (ref 43.0–77.0)
Platelets: 306 10*3/uL (ref 150.0–400.0)
RBC: 4.82 Mil/uL (ref 3.87–5.11)
RDW: 13.6 % (ref 11.5–15.5)
WBC: 5.3 10*3/uL (ref 4.0–10.5)

## 2024-04-25 LAB — IBC + FERRITIN
Ferritin: 76.5 ng/mL (ref 10.0–291.0)
Iron: 139 ug/dL (ref 42–145)
Saturation Ratios: 33 % (ref 20.0–50.0)
TIBC: 421.4 ug/dL (ref 250.0–450.0)
Transferrin: 301 mg/dL (ref 212.0–360.0)

## 2024-04-25 LAB — COMPREHENSIVE METABOLIC PANEL WITH GFR
ALT: 21 U/L (ref 0–35)
AST: 21 U/L (ref 0–37)
Albumin: 4.7 g/dL (ref 3.5–5.2)
Alkaline Phosphatase: 58 U/L (ref 39–117)
BUN: 10 mg/dL (ref 6–23)
CO2: 27 meq/L (ref 19–32)
Calcium: 9.3 mg/dL (ref 8.4–10.5)
Chloride: 102 meq/L (ref 96–112)
Creatinine, Ser: 0.84 mg/dL (ref 0.40–1.20)
GFR: 81.36 mL/min (ref >60.00)
Glucose, Bld: 84 mg/dL (ref 70–99)
Potassium: 4 meq/L (ref 3.5–5.1)
Sodium: 140 meq/L (ref 135–145)
Total Bilirubin: 1.1 mg/dL (ref 0.2–1.2)
Total Protein: 7.2 g/dL (ref 6.0–8.3)

## 2024-04-25 LAB — LIPID PANEL
Cholesterol: 323 mg/dL — ABNORMAL HIGH (ref 0–200)
HDL: 57.2 mg/dL (ref >39.00)
LDL Cholesterol: 230 mg/dL — ABNORMAL HIGH (ref 0–99)
NonHDL: 266.2
Total CHOL/HDL Ratio: 6
Triglycerides: 179 mg/dL — ABNORMAL HIGH (ref 0.0–149.0)
VLDL: 35.8 mg/dL (ref 0.0–40.0)

## 2024-04-25 LAB — TSH: TSH: 0.96 u[IU]/mL (ref 0.35–5.50)

## 2024-04-25 LAB — VITAMIN B12: Vitamin B-12: 299 pg/mL (ref 211–911)

## 2024-04-25 NOTE — Patient Instructions (Addendum)
 It was great to see you!  -We will place referral to gastroenterology for colonoscopy -we will update blood work to assess for numbness/tingling; we may send you to sports medicine if blood work does not explain symptom(s) -we will plan to enroll you in lung cancer screening program when you turn 48 due to smoking history   Please go to the lab for blood work.   Our office will call you with your results unless you have chosen to receive results via MyChart.  If your blood work is normal we will follow-up each year for physicals and as scheduled for chronic medical problems.  If anything is abnormal we will treat accordingly and get you in for a follow-up.  Take care,  Maddyson Keil

## 2024-04-25 NOTE — Progress Notes (Signed)
 Subjective:    Tamara [name] is a 48 y.o. female and is here for a comprehensive physical exam.  HPI  Health Maintenance Due  Topic Date Due   Colonoscopy  Never done    Acute Concerns: Neuropathy Over the past few months has had numbness and tingling to her left toes/foot Mom had history of neuropathy Denies: low back pain, numbness/tingling to other extremities, balance issues, weakness  Chronic Issues: Tobacco abuse Has started smoking again due to stress  Health Maintenance: Immunizations -- UpToDate Colonoscopy -- overdue Mammogram -- Oct 2024 - UpToDate  PAP -- March 2021 -- UpToDate  Bone Density -- n/a Diet -- working on better portions throughout the day Exercise -- has treadmill at her desk; gets at least 3 miles most days  Sleep habits -- no major complaints Mood -- overall stable  UTD with dentist? - no UTD with eye doctor? - no  Weight history: Wt Readings from Last 10 Encounters:  04/25/24 169 lb 8 oz (76.9 kg)  02/04/23 172 lb (78 kg)  12/25/21 162 lb (73.5 kg)  12/14/21 163 lb 4 oz (74 kg)  06/16/21 165 lb 12.8 oz (75.2 kg)  12/24/20 167 lb 8 oz (76 kg)  03/07/20 167 lb 12.8 oz (76.1 kg)  02/07/20 169 lb (76.7 kg)  12/21/19 172 lb (78 kg)  03/09/17 163 lb 12.8 oz (74.3 kg)   Body mass index is 25.77 kg/m. Patient's last menstrual period was 07/26/2011.  Alcohol use:  reports current alcohol use of about 6.0 standard drinks of alcohol per week.  Tobacco use:  Tobacco Use: High Risk (04/25/2024)   Patient History    Smoking Tobacco Use: Every Day    Smokeless Tobacco Use: Never    Passive Exposure: Not on file   Eligible for lung cancer screening? She will likely be at her 48th birthday; she is agreeable to this     04/25/2024    9:43 AM  Depression screen PHQ 2/9  Decreased Interest 0  Down, Depressed, Hopeless 0  PHQ - 2 Score 0     Other providers/specialists: Patient Care Team: Alexander Iba, [place] as PCP - General  (Physician Assistant)    PMHx, SurgHx, SocialHx, Medications, and Allergies were reviewed in the Visit Navigator and updated as appropriate.   Past Medical History:  Diagnosis Date   Abnormal Pap smear    Anemia    Family history of breast cancer    Hyperlipidemia    no meds   Infertility, female      Past Surgical History:  Procedure Laterality Date   CESAREAN SECTION  06/07/2012   Procedure: CESAREAN SECTION;  Surgeon: Carvel Clarity, MD;  Location: [facility];  Service: Gynecology;  Laterality: N/A;   DILATION AND CURETTAGE OF UTERUS     TOOTH EXTRACTION     age [age]     Family History  Problem Relation Age of Onset   Breast cancer Mother 49       second cancer at 25   Depression Mother    Cancer - Colon Mother    Stroke Mother    Colon cancer Mother    Alcohol abuse Father    Cancer Father        unknown   Breast cancer Maternal Grandmother 25   Alzheimer's disease Maternal Grandmother    Dementia Maternal Grandmother    Cancer Maternal Grandmother    Prostate cancer Maternal Grandfather    Cancer Maternal Grandfather  Alcohol abuse Paternal Grandfather    Seizures Maternal Aunt    Bipolar disorder Maternal Aunt    Breast cancer Maternal Aunt 45       second dx 48   Cancer Maternal Aunt    Depression Maternal Aunt    Bipolar disorder Maternal Aunt    Breast cancer Maternal Aunt 52   Cancer Maternal Aunt    Throat cancer Maternal Uncle    Cancer Maternal Uncle    Alcohol abuse Paternal Uncle    Drug abuse Paternal Uncle    Breast cancer Other        MGMs mother   Prostate cancer Other        MGFs brother   Colon polyps Neg Hx    Rectal cancer Neg Hx    Stomach cancer Neg Hx     Social History   Tobacco Use   Smoking status: Every Day    Current packs/day: 0.50    Average packs/day: 0.7 packs/day for 28.4 years (19.0 ttl pk-yrs)    Types: Cigarettes    Start date: 68    Last attempt to quit: 2013   Smokeless tobacco: Never  Substance Use  Topics   Alcohol use: Yes    Alcohol/week: 6.0 standard drinks of alcohol    Types: 6 Glasses of wine per week   Drug use: No    Review of Systems:   Review of Systems  Constitutional:  Negative for chills, fever, malaise/fatigue and weight loss.  HENT:  Negative for hearing loss, sinus pain and sore throat.   Respiratory:  Negative for cough and hemoptysis.   Cardiovascular:  Negative for chest pain, palpitations, leg swelling and PND.  Gastrointestinal:  Negative for abdominal pain, constipation, diarrhea, heartburn, nausea and vomiting.  Genitourinary:  Negative for dysuria, frequency and urgency.  Musculoskeletal:  Negative for back pain, myalgias and neck pain.  Skin:  Negative for itching and rash.  Neurological:  Positive for tingling. Negative for dizziness, seizures and headaches.  Endo/Heme/Allergies:  Negative for polydipsia.  Psychiatric/Behavioral:  Negative for depression. The patient is not nervous/anxious.     Objective:   BP 110/60 (BP Location: Left Arm, Patient Position: Sitting, Cuff Size: Large)   Pulse 67   Temp 97.9 F (36.6 C) (Temporal)   Ht 5\' 8"  (1.727 m)   Wt 169 lb 8 oz (76.9 kg)   LMP 07/26/2011   SpO2 98%   BMI 25.77 kg/m  Body mass index is 25.77 kg/m.   General Appearance:    Alert, cooperative, no distress, appears stated age  Head:    Normocephalic, without obvious abnormality, atraumatic  Eyes:    PERRL, conjunctiva/corneas clear, EOM's intact, fundi    benign, both eyes  Ears:    Normal TM's and external ear canals, both ears  Nose:   Nares normal, septum midline, mucosa normal, no drainage    or sinus tenderness  Throat:   Lips, mucosa, and tongue normal; teeth and gums normal  Neck:   Supple, symmetrical, trachea midline, no adenopathy;    thyroid:  no enlargement/tenderness/nodules; no carotid   bruit or JVD  Back:     Symmetric, no curvature, ROM normal, no CVA tenderness  Lungs:     Clear to auscultation bilaterally,  respirations unlabored  Chest Wall:    No tenderness or deformity   Heart:    Regular rate and rhythm, S1 and S2 normal, no murmur, rub or gallop  Breast Exam:    Deferred  Abdomen:     Soft, non-tender, bowel sounds active all four quadrants,    no masses, no organomegaly  Genitalia:    Deferred  Extremities:   Extremities normal, atraumatic, no cyanosis or edema  Pulses:   2+ and symmetric all extremities  Skin:   Skin color, texture, turgor normal, no rashes or lesions  Lymph nodes:   Cervical, supraclavicular, and axillary nodes normal  Neurologic:   CNII-XII intact, normal strength, sensation and reflexes    throughout    Assessment/Plan:   Routine physical examination Today patient counseled on age appropriate routine health concerns for screening and prevention, each reviewed and up to date or declined. Immunizations reviewed and up to date or declined. Labs ordered and reviewed. Risk factors for depression reviewed and negative. Hearing function and visual acuity are intact. ADLs screened and addressed as needed. Functional ability and level of safety reviewed and appropriate. Education, counseling and referrals performed based on assessed risks today. Patient provided with a copy of personalized plan for preventive services.  Screening for colon cancer Referral placed  Hyperlipidemia, unspecified hyperlipidemia type Update lipid panel and make recommendations   Paresthesias No red flags Will update blood work to rule out organic cause Will refer to sports medicine if symptom(s) not explained by blood work  Tobacco abuse Encouraged reduction/cessation Will send for LDCT screening when due  Alexander Iba, PA-C [facility]

## 2024-04-26 ENCOUNTER — Ambulatory Visit: Payer: Self-pay | Admitting: Physician Assistant

## 2024-05-14 ENCOUNTER — Other Ambulatory Visit: Payer: Self-pay | Admitting: Physician Assistant

## 2024-05-14 DIAGNOSIS — E785 Hyperlipidemia, unspecified: Secondary | ICD-10-CM

## 2024-05-16 ENCOUNTER — Other Ambulatory Visit (HOSPITAL_BASED_OUTPATIENT_CLINIC_OR_DEPARTMENT_OTHER): Payer: Self-pay

## 2024-05-16 ENCOUNTER — Encounter: Payer: Self-pay | Admitting: Physician Assistant

## 2024-05-16 ENCOUNTER — Other Ambulatory Visit: Payer: Self-pay | Admitting: Physician Assistant

## 2024-05-16 ENCOUNTER — Telehealth: Payer: Self-pay | Admitting: *Deleted

## 2024-05-16 MED ORDER — ATORVASTATIN CALCIUM 20 MG PO TABS
20.0000 mg | ORAL_TABLET | Freq: Every day | ORAL | 3 refills | Status: AC
  Filled 2024-05-16: qty 90, 90d supply, fill #0
  Filled 2024-09-11: qty 90, 90d supply, fill #1

## 2024-05-16 NOTE — Telephone Encounter (Signed)
 Copied from CRM [id number]. Topic: General - Other >> May 16, 2024  8:54 AM Rosina BIRCH wrote: Reason for CRM: patient called stating she saw on the TV that a case of measles broke out and they told them where the locations where at. The patient stated her family were at one of the locations the airport and she was instructed to contact her doctor. The patient does not have any symptoms and she want to be advised on what to do. The patient is calling for her and her husband CB [phone]

## 2024-05-16 NOTE — Telephone Encounter (Signed)
 Please see message and advise

## 2024-06-06 ENCOUNTER — Ambulatory Visit (HOSPITAL_BASED_OUTPATIENT_CLINIC_OR_DEPARTMENT_OTHER)
Admission: RE | Admit: 2024-06-06 | Discharge: 2024-06-06 | Disposition: A | Discharge status: Home / Self Care | Payer: Self-pay | Source: Ambulatory Visit | Attending: Physician Assistant | Admitting: Physician Assistant

## 2024-06-06 DIAGNOSIS — E785 Hyperlipidemia, unspecified: Secondary | ICD-10-CM | POA: Insufficient documentation

## 2024-06-11 ENCOUNTER — Ambulatory Visit: Payer: Self-pay | Admitting: Physician Assistant

## 2024-06-25 ENCOUNTER — Other Ambulatory Visit: Payer: Self-pay | Admitting: Physician Assistant

## 2024-06-25 DIAGNOSIS — Z720 Tobacco use: Secondary | ICD-10-CM

## 2024-06-29 ENCOUNTER — Telehealth: Payer: Self-pay | Admitting: Physician Assistant

## 2024-06-29 NOTE — Telephone Encounter (Signed)
 Copied from CRM [id number]. Topic: Appointments - Scheduling Inquiry for Clinic >> Jun 29, 2024 11:07 AM Deleta RAMAN wrote: Reason for CRM: patient would like to have labs done for cholesterol levels due to medication she taking and 6 weeks follow up

## 2024-06-29 NOTE — Telephone Encounter (Signed)
 Please see pt call msg and advise on future lab order and scheduling 6 wk follow up to discuss results

## 2024-07-02 ENCOUNTER — Other Ambulatory Visit: Payer: Self-pay

## 2024-07-02 DIAGNOSIS — E785 Hyperlipidemia, unspecified: Secondary | ICD-10-CM

## 2024-07-02 NOTE — Telephone Encounter (Signed)
 Future lab orders placed per PCP approval; please contact pt to schedule appropriate appt per PCP note.

## 2024-07-03 ENCOUNTER — Other Ambulatory Visit (INDEPENDENT_AMBULATORY_CARE_PROVIDER_SITE_OTHER)

## 2024-07-03 DIAGNOSIS — E785 Hyperlipidemia, unspecified: Secondary | ICD-10-CM | POA: Diagnosis not present

## 2024-07-04 ENCOUNTER — Ambulatory Visit: Payer: Self-pay | Admitting: Physician Assistant

## 2024-07-04 LAB — LIPID PANEL
Cholesterol: 196 mg/dL (ref <200)
HDL: 68 mg/dL (ref >=50)
LDL Cholesterol (Calc): 107 mg/dL — ABNORMAL HIGH
Non-HDL Cholesterol (Calc): 128 mg/dL (ref <130)
Total CHOL/HDL Ratio: 2.9 (calc) (ref <5.0)
Triglycerides: 112 mg/dL (ref <150)

## 2024-07-09 ENCOUNTER — Ambulatory Visit (AMBULATORY_SURGERY_CENTER): Admitting: *Deleted

## 2024-07-09 ENCOUNTER — Encounter: Payer: Self-pay | Admitting: Internal Medicine

## 2024-07-09 ENCOUNTER — Other Ambulatory Visit (HOSPITAL_BASED_OUTPATIENT_CLINIC_OR_DEPARTMENT_OTHER): Payer: Self-pay

## 2024-07-09 VITALS — Ht 68.0 in | Wt 169.0 lb

## 2024-07-09 DIAGNOSIS — Z1211 Encounter for screening for malignant neoplasm of colon: Secondary | ICD-10-CM

## 2024-07-09 MED ORDER — NA SULFATE-K SULFATE-MG SULF 17.5-3.13-1.6 GM/177ML PO SOLN
1.0000 | Freq: Once | ORAL | 0 refills | Status: AC
  Filled 2024-07-09: qty 354, 1d supply, fill #0

## 2024-07-09 NOTE — Progress Notes (Signed)
 Pt's name and DOB verified at the beginning of the pre-visit with 2 identifiers  Pt denies any difficulty with ambulating,sitting, laying down or rolling side to side   Pt uses ambulation assistance device or has issues with mobiiity  Pt has no issues moving head neck or swallowing  No egg or soy allergy known to patient   No issues known to pt with past sedation with any surgeries or procedures  Patient denies ever being intubated  No FH of Malignant Hyperthermia  Pt is not on home 02   Pt is not on blood thinners   Pt denies issues with constipation   Pt is not on dialysis  Pt denise any abnormal heart rhythms   Pt denies any upcoming cardiac testing  Patient's chart reviewed by Norleen Schillings CNRA prior to pre-visit and patient appropriate for the [facility].  Pre-visit completed and red dot placed by patient's name on their procedure day (on provider's schedule).    Visit by phone  Pt states weight is 169 lb  Instructions reviewed. Pt given  both [facility] main # and MD on call # prior to instructions.   Informed pt to come in at the time discussed and is shown on PV instructions. Pt informed that they are coming in i.e. cloths change.,IV placement , Consent signing and meeting CRNA. Pt states understanding after  given opportunity to ask questions t after all  instructions given.  Pt instructed to use Singlecare.com or GoodRx for a price reduction on prep  Instructed pt to review instructions again prior to procedure and call main # given if has any questions.. Pt states they will.  Instructed pt where to find PV instructions in My Chart  Instructed pt on all aspects of written instructions including med holds clothing to wear and foods to eat and not eat as well as after procedure legal restrictions and to cal MD on call if needed.. Pt states understanding.

## 2024-07-11 ENCOUNTER — Other Ambulatory Visit (HOSPITAL_BASED_OUTPATIENT_CLINIC_OR_DEPARTMENT_OTHER): Payer: Self-pay

## 2024-07-24 NOTE — Progress Notes (Unsigned)
 [facility] History and Physical   Primary Care Physician:  Job Lukes, [place]   Reason for Procedure:    Encounter Diagnoses  Name Primary?   Colon cancer screening Yes   Family history of colon cancer in mother      Plan:    colonoscopy     HPI: Tamara [name] is a 48 y.o. female presenting for screening colonoscopy w/ family hx CRCA in mother.   Past Medical History:  Diagnosis Date   Abnormal Pap smear    Anemia    Family history of breast cancer    Hyperlipidemia    no meds   Infertility, female     Past Surgical History:  Procedure Laterality Date   CESAREAN SECTION  06/07/2012   Procedure: CESAREAN SECTION;  Surgeon: Charlie JONETTA Aho, MD;  Location: [facility];  Service: Gynecology;  Laterality: N/A;   DILATION AND CURETTAGE OF UTERUS     TOOTH EXTRACTION     age [age]     Current Outpatient Medications  Medication Sig Dispense Refill   acetaminophen  (TYLENOL ) 325 MG tablet Take 650 mg by mouth every 6 (six) hours as needed for moderate pain.      aspirin EC 81 MG tablet Take 81 mg by mouth daily. Swallow whole.     atorvastatin  (LIPITOR) 20 MG tablet Take 1 tablet (20 mg total) by mouth daily. 90 tablet 3   ibuprofen  (ADVIL ) 600 MG tablet Take 1 tablet (600 mg total) by mouth every 6 (six) hours as needed. 30 tablet 0   Multiple Vitamins-Minerals (MULTI-VITAMIN GUMMIES PO) Take 2 each by mouth daily in the afternoon.     No current facility-administered medications for this visit.    Allergies as of 07/25/2024 - Review Complete 07/09/2024  Allergen Reaction Noted   Penicillins Nausea Only 06/06/2012    Family History  Problem Relation Age of Onset   Breast cancer Mother 2       second cancer at 15   Depression Mother    Cancer - Colon Mother    Stroke Mother    Colon cancer Mother    Alcohol abuse Father    Cancer Father        unknown   Seizures Maternal Aunt    Bipolar disorder Maternal Aunt    Breast cancer Maternal Aunt 45        second dx 42   Cancer Maternal Aunt    Depression Maternal Aunt    Bipolar disorder Maternal Aunt    Breast cancer Maternal Aunt 52   Cancer Maternal Aunt    Throat cancer Maternal Uncle    Cancer Maternal Uncle    Alcohol abuse Paternal Uncle    Drug abuse Paternal Uncle    Breast cancer Maternal Grandmother 47   Alzheimer's disease Maternal Grandmother    Dementia Maternal Grandmother    Cancer Maternal Grandmother    Prostate cancer Maternal Grandfather    Cancer Maternal Grandfather    Alcohol abuse Paternal Grandfather    Breast cancer Other        MGMs mother   Prostate cancer Other        MGFs brother   Colon polyps Neg Hx    Rectal cancer Neg Hx    Stomach cancer Neg Hx    Crohn's disease Neg Hx    Esophageal cancer Neg Hx     Social History   Socioeconomic History   Marital status: Married    Spouse name: Not  on file   Number of children: 1   Years of education: Not on file   Highest education level: Bachelor's degree (e.g., BA, AB, BS)  Occupational History    Employer: [employer]  Tobacco Use   Smoking status: Every Day    Current packs/day: 0.50    Average packs/day: 0.7 packs/day for 28.7 years (19.1 ttl pk-yrs)    Types: Cigarettes    Start date: 63    Last attempt to quit: 2013   Smokeless tobacco: Never  Vaping Use   Vaping status: Never Used  Substance and Sexual Activity   Alcohol use: Yes    Alcohol/week: 6.0 standard drinks of alcohol    Types: 6 Glasses of wine per week   Drug use: No   Sexual activity: Yes    Birth control/protection: None  Other Topics Concern   Not on file  Social History Narrative   Investment banker, 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   Married   28 yo son   Social Drivers of Corporate investment banker Strain: Low Risk  (04/23/2024)   Overall Financial Resource Strain (CARDIA)    Difficulty of Paying Living Expenses: Not hard at all  Food Insecurity: No Food Insecurity (04/23/2024)   Hunger  Vital Sign    Worried About Running Out of Food in the Last Year: Never true    Ran Out of Food in the Last Year: Never true  Transportation Needs: No Transportation Needs (04/23/2024)   PRAPARE - Administrator, Civil Service (Medical): No    Lack of Transportation (Non-Medical): No  Physical Activity: Insufficiently Active (04/23/2024)   Exercise Vital Sign    Days of Exercise per Week: 2 days    Minutes of Exercise per Session: 20 min  Stress: No Stress Concern Present (04/23/2024)   [employer] of Occupational Health - Occupational Stress Questionnaire    Feeling of Stress : Only a little  Social Connections: Unknown (04/23/2024)   Social Connection and Isolation Panel    Frequency of Communication with Friends and Family: Once a week    Frequency of Social Gatherings with Friends and Family: Patient declined    Attends Religious Services: Never    Database administrator or Organizations: No    Attends Engineer, structural: Not on file    Marital Status: Married  Catering manager Violence: Not on file    Review of Systems: Positive for *** All other review of systems negative except as mentioned in the HPI.  Physical Exam: Vital signs LMP 07/26/2011   General:   Alert,  Well-developed, well-nourished, pleasant and cooperative in NAD Lungs:  Clear throughout to auscultation.   Heart:  Regular rate and rhythm; no murmurs, clicks, rubs,  or gallops. Abdomen:  Soft, nontender and nondistended. Normal bowel sounds.   Neuro/Psych:  Alert and cooperative. Normal mood and affect. A and O x 3   @Tamara [name]  Tamara Commander, MD, [facility] [phone] (pager) 07/24/2024 9:01 PM@

## 2024-07-25 ENCOUNTER — Encounter: Payer: Self-pay | Admitting: Internal Medicine

## 2024-07-25 ENCOUNTER — Ambulatory Visit (AMBULATORY_SURGERY_CENTER): Admitting: Internal Medicine

## 2024-07-25 VITALS — BP 110/67 | HR 62 | Temp 97.6°F | Resp 14 | Ht 68.0 in | Wt 169.0 lb

## 2024-07-25 DIAGNOSIS — Z1211 Encounter for screening for malignant neoplasm of colon: Secondary | ICD-10-CM

## 2024-07-25 DIAGNOSIS — K635 Polyp of colon: Secondary | ICD-10-CM

## 2024-07-25 DIAGNOSIS — Z800 Family history of malignant neoplasm of digestive organs: Secondary | ICD-10-CM | POA: Diagnosis not present

## 2024-07-25 DIAGNOSIS — K644 Residual hemorrhoidal skin tags: Secondary | ICD-10-CM

## 2024-07-25 DIAGNOSIS — D124 Benign neoplasm of descending colon: Secondary | ICD-10-CM

## 2024-07-25 DIAGNOSIS — K5730 Diverticulosis of large intestine without perforation or abscess without bleeding: Secondary | ICD-10-CM | POA: Diagnosis not present

## 2024-07-25 MED ORDER — SODIUM CHLORIDE 0.9 % IV SOLN: 500.0000 mL | Freq: Once | INTRAVENOUS | Status: DC

## 2024-07-25 NOTE — Patient Instructions (Addendum)
 I found and removed one polyp.   You also have a condition called diverticulosis - common and not usually a problem. Please read the handout provided.  Hemorrhoids also seen.  Plan on repeat colonoscopy in 5 years.  I appreciate the opportunity to care for you. Lupita CHARLENA Commander, MD, FACG  YOU HAD AN ENDOSCOPIC PROCEDURE TODAY AT THE [facility]:   Refer to the procedure report that was given to you for any specific questions about what was found during the examination.  If the procedure report does not answer your questions, please call your gastroenterologist to clarify.  If you requested that your care partner not be given the details of your procedure findings, then the procedure report has been included in a sealed envelope for you to review at your convenience later.  YOU SHOULD EXPECT: Some feelings of bloating in the abdomen. Passage of more gas than usual.  Walking can help get rid of the air that was put into your GI tract during the procedure and reduce the bloating. If you had a lower endoscopy (such as a colonoscopy or flexible sigmoidoscopy) you may notice spotting of blood in your stool or on the toilet paper. If you underwent a bowel prep for your procedure, you may not have a normal bowel movement for a few days.  Please Note:  You might notice some irritation and congestion in your nose or some drainage.  This is from the oxygen used during your procedure.  There is no need for concern and it should clear up in a day or so.  SYMPTOMS TO REPORT IMMEDIATELY:  Following lower endoscopy (colonoscopy or flexible sigmoidoscopy):  Excessive amounts of blood in the stool  Significant tenderness or worsening of abdominal pains  Swelling of the abdomen that is new, acute  Fever of 100F or higher  For urgent or emergent issues, a gastroenterologist can be reached at any hour by calling [phone]. Do not use MyChart messaging for urgent concerns.    DIET:  We do  recommend a small meal at first, but then you may proceed to your regular diet.  Drink plenty of fluids but you should avoid alcoholic beverages for 24 hours.  ACTIVITY:  You should plan to take it easy for the rest of today and you should NOT DRIVE or use heavy machinery until tomorrow (because of the sedation medicines used during the test).    FOLLOW UP: Our staff will call the number listed on your records the next business day following your procedure.  We will call around 7:15- 8:00 am to check on you and address any questions or concerns that you may have regarding the information given to you following your procedure. If we do not reach you, we will leave a message.     If any biopsies were taken you will be contacted by phone or by letter within the next 1-3 weeks.  Please call us  at [phone] if you have not heard about the biopsies in 3 weeks.    SIGNATURES/CONFIDENTIALITY: You and/or your care partner have signed paperwork which will be entered into your electronic medical record.  These signatures attest to the fact that that the information above on your After Visit Summary has been reviewed and is understood.  Full responsibility of the confidentiality of this discharge information lies with you and/or your care-partner.

## 2024-07-25 NOTE — Progress Notes (Signed)
 Report given to PACU, vss

## 2024-07-25 NOTE — Op Note (Signed)
 [facility] Patient Name: Tamara [name] Procedure Date: 07/25/2024 9:42 AM MRN: [id number] Endoscopist: Lupita FORBES Commander , MD, [id number] Age: 48 Referring MD:  Date of Birth: [birth year] Gender: Female Account #: [id number] Procedure:                Colonoscopy Indications:              Colon cancer screening in patient at increased                            risk: Colorectal cancer in mother - diagnosed at 75                            and died at 72. Medicines:                Monitored Anesthesia Care Procedure:                Pre-Anesthesia Assessment:                           - Prior to the procedure, a History and Physical                            was performed, and patient medications and                            allergies were reviewed. The patient's tolerance of                            previous anesthesia was also reviewed. The risks                            and benefits of the procedure and the sedation                            options and risks were discussed with the patient.                            All questions were answered, and informed consent                            was obtained. Prior Anticoagulants: The patient has                            taken no anticoagulant or antiplatelet agents. ASA                            Grade Assessment: II - A patient with mild systemic                            disease. After reviewing the risks and benefits,                            the patient was deemed in satisfactory condition to  undergo the procedure.                           After obtaining informed consent, the colonoscope                            was passed under direct vision. Throughout the                            procedure, the patient's blood pressure, pulse, and                            oxygen saturations were monitored continuously. The                            Olympus Scope SN: [id number] was introduced  through                            the anus and advanced to the the cecum, identified                            by appendiceal orifice and ileocecal valve. The                            colonoscopy was performed without difficulty. The                            patient tolerated the procedure well. The quality                            of the bowel preparation was good. The ileocecal                            valve, appendiceal orifice, and rectum were                            photographed. The bowel preparation used was SUPREP                            via split dose instruction. Scope In: 9:58:15 AM Scope Out: 10:09:43 AM Scope Withdrawal Time: 0 hours 8 minutes 52 seconds  Total Procedure Duration: 0 hours 11 minutes 28 seconds  Findings:                 Hemorrhoids were found on perianal exam.                           A 6 mm polyp was found in the descending colon. The                            polyp was sessile. The polyp was removed with a                            cold snare. Resection and retrieval were complete.  Verification of patient identification for the                            specimen was done. Estimated blood loss was minimal.                           Multiple small-mouthed diverticula were found in                            the sigmoid colon.                           External hemorrhoids were found during retroflexion                            and during perianal exam.                           The exam was otherwise without abnormality on                            direct and retroflexion views. Complications:            No immediate complications. Estimated Blood Loss:     Estimated blood loss was minimal. Impression:               - Hemorrhoids found on perianal exam.                           - One 6 mm polyp in the descending colon, removed                            with a cold snare. Resected and retrieved.                            - Diverticulosis in the sigmoid colon.                           - External hemorrhoids.                           - The examination was otherwise normal on direct                            and retroflexion views. Recommendation:           - Patient has a contact number available for                            emergencies. The signs and symptoms of potential                            delayed complications were discussed with the                            patient. Return to normal activities tomorrow.  Written discharge instructions were provided to the                            patient.                           - Resume previous diet.                           - Continue present medications.                           - Await pathology results.                           - Repeat colonoscopy in 5 years. Lupita FORBES Commander, MD 07/25/2024 10:16:36 AM This report has been signed electronically.

## 2024-07-25 NOTE — Progress Notes (Signed)
 Called to room to assist during endoscopic procedure.  Patient ID and intended procedure confirmed with present staff. Received instructions for my participation in the procedure from the performing physician.

## 2024-07-26 ENCOUNTER — Telehealth: Payer: Self-pay

## 2024-07-26 NOTE — Telephone Encounter (Signed)
 Follow up call to pt, lm for pt to call if having any difficulty with normal activities or eating and drinking.  Also to call if any other questions or concerns.

## 2024-07-27 LAB — SURGICAL PATHOLOGY

## 2024-07-28 ENCOUNTER — Ambulatory Visit: Payer: Self-pay | Admitting: Internal Medicine

## 2024-09-11 ENCOUNTER — Other Ambulatory Visit (HOSPITAL_BASED_OUTPATIENT_CLINIC_OR_DEPARTMENT_OTHER): Payer: Self-pay

## 2024-09-24 ENCOUNTER — Other Ambulatory Visit: Payer: Self-pay | Admitting: Physician Assistant

## 2024-09-24 DIAGNOSIS — Z1231 Encounter for screening mammogram for malignant neoplasm of breast: Secondary | ICD-10-CM

## 2024-10-12 ENCOUNTER — Ambulatory Visit
Admission: RE | Admit: 2024-10-12 | Discharge: 2024-10-12 | Disposition: A | Discharge status: Home / Self Care | Source: Ambulatory Visit | Attending: Physician Assistant | Admitting: Physician Assistant

## 2024-10-12 DIAGNOSIS — Z1231 Encounter for screening mammogram for malignant neoplasm of breast: Secondary | ICD-10-CM | POA: Diagnosis not present
